# Patient Record
Sex: Male | Born: 1955
Health system: Southern US, Community
[De-identification: ages and names within clinical notes are randomized; demographics above are authoritative.]

## PROBLEM LIST (undated history)

## (undated) DIAGNOSIS — G4733 Obstructive sleep apnea (adult) (pediatric): Secondary | ICD-10-CM

## (undated) DIAGNOSIS — F5102 Adjustment insomnia: Secondary | ICD-10-CM

## (undated) DIAGNOSIS — E782 Mixed hyperlipidemia: Secondary | ICD-10-CM

## (undated) DIAGNOSIS — Z9049 Acquired absence of other specified parts of digestive tract: Secondary | ICD-10-CM

## (undated) DIAGNOSIS — E298 Other testicular dysfunction: Secondary | ICD-10-CM

## (undated) DIAGNOSIS — N200 Calculus of kidney: Secondary | ICD-10-CM

## (undated) DIAGNOSIS — I10 Essential (primary) hypertension: Secondary | ICD-10-CM

## (undated) DIAGNOSIS — Z9089 Acquired absence of other organs: Secondary | ICD-10-CM

## (undated) HISTORY — PX: KNEE SURGERY: SHX244

## (undated) HISTORY — DX: Adjustment insomnia: F51.02

## (undated) HISTORY — PX: APPENDECTOMY: SHX54

## (undated) HISTORY — PX: TONSILLECTOMY: SUR1361

## (undated) HISTORY — DX: Other testicular dysfunction: E29.8

## (undated) HISTORY — DX: Essential (primary) hypertension: I10

## (undated) HISTORY — DX: Mixed hyperlipidemia: E78.2

## (undated) HISTORY — DX: Obstructive sleep apnea (adult) (pediatric): G47.33

---
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
## 2009-12-15 ENCOUNTER — Inpatient Hospital Stay (HOSPITAL_COMMUNITY): Admission: RE | Admit: 2009-12-15 | Discharge: 2009-12-16 | Payer: Self-pay | Admitting: *Deleted

## 2010-05-06 NOTE — Discharge Summary (Signed)
  NAME:  Robert Mckinney, Robert Mckinney                   ACCOUNT NO.:  [id number]  MEDICAL RECORD NO.:  [id number]          PATIENT TYPE:  INP  LOCATION:  5039                         FACILITY:  [facility]  PHYSICIAN:  Estill Bamberg, MD      DATE OF BIRTH:  [birth year]  DATE OF ADMISSION:  12/15/2009 DATE OF DISCHARGE:  12/16/2009                              DISCHARGE SUMMARY   ADMISSION DIAGNOSIS:  Acute right quadriceps tear.  DISCHARGE DIAGNOSIS:  Acute right quadriceps tear status post right quadriceps repair.  ADMITTING PHYSICIAN:  Estill Bamberg, MD  ADMISSION HISTORY:  Briefly, Mr. Pischke is a pleasant 55 year old male who presented to my office with acute right quadriceps tear.  I therefore had a discussion with the patient regarding going forward with repair of his torn right quadriceps tendon.  The patient was therefore admitted on December 15, 2009, for the procedure outlined above.  HOSPITAL COURSE:  On December 15, 2009, the patient underwent the procedure outlined above.  He tolerated the procedure well and was transferred to recovery in stable condition.  The patient was observed overnight for pain control.  On the morning of postoperative day #1, it was felt that the patient's pain was well controlled and the patient will be safe for discharge home.  DISCHARGE INSTRUCTIONS:  The patient was given a Bledsoe brace that was to be locked in extension.  The patient was told he can bear full weight with his knee in full extension initially.  I will see the patient in my office in approximately 2 weeks to help guide him through a progressive range of motion and strengthening program.  The patient was given Percocet for pain.     Estill Bamberg, MD     MD/MEDQ  D:  05/02/2010  T:  05/02/2010  Job:  [id number]  Electronically Signed by Estill Bamberg  on 05/06/2010 08:12:35 AM

## 2010-05-19 LAB — SURGICAL PCR SCREEN
MRSA, PCR: NEGATIVE
Staphylococcus aureus: POSITIVE — AB

## 2010-05-19 LAB — DIFFERENTIAL
Eosinophils Absolute: 0.1 10*3/uL (ref 0.0–0.7)
Eosinophils Relative: 1 % (ref 0–5)
Lymphocytes Relative: 28 % (ref 12–46)
Lymphs Abs: 2.3 10*3/uL (ref 0.7–4.0)
Monocytes Relative: 10 % (ref 3–12)
Neutrophils Relative %: 61 % (ref 43–77)

## 2010-05-19 LAB — COMPREHENSIVE METABOLIC PANEL
ALT: 34 U/L (ref 0–53)
AST: 25 U/L (ref 0–37)
Calcium: 9.6 mg/dL (ref 8.4–10.5)
Creatinine, Ser: 1.27 mg/dL (ref 0.4–1.5)
GFR calc Af Amer: >60 mL/min (ref >60)
Sodium: 141 mEq/L (ref 135–145)
Total Protein: 6.6 g/dL (ref 6.0–8.3)

## 2010-05-19 LAB — URINALYSIS, ROUTINE W REFLEX MICROSCOPIC
Hgb urine dipstick: NEGATIVE
Protein, ur: NEGATIVE mg/dL
Specific Gravity, Urine: 1.021 (ref 1.005–1.030)
Urobilinogen, UA: 0.2 mg/dL (ref 0.0–1.0)

## 2010-05-19 LAB — CBC
MCH: 31.6 pg (ref 26.0–34.0)
MCHC: 34.9 g/dL (ref 30.0–36.0)
RDW: 13.8 % (ref 11.5–15.5)

## 2010-05-19 LAB — PROTIME-INR
INR: 0.98 (ref 0.00–1.49)
Prothrombin Time: 13.2 seconds (ref 11.6–15.2)

## 2010-05-19 LAB — TYPE AND SCREEN

## 2012-12-23 ENCOUNTER — Encounter: Payer: Self-pay | Admitting: Pulmonary Disease

## 2012-12-23 ENCOUNTER — Ambulatory Visit (INDEPENDENT_AMBULATORY_CARE_PROVIDER_SITE_OTHER): Payer: BC Managed Care – PPO | Admitting: Pulmonary Disease

## 2012-12-23 VITALS — BP 132/94 | HR 68 | Temp 98.2°F | Ht 70.0 in | Wt 249.6 lb

## 2012-12-23 DIAGNOSIS — G4733 Obstructive sleep apnea (adult) (pediatric): Secondary | ICD-10-CM | POA: Insufficient documentation

## 2012-12-23 NOTE — Progress Notes (Signed)
Subjective:    Patient ID: Robert Mckinney, male    DOB: 1955/03/25, 57 y.o.   MRN: [id number]  HPI The patient is a 57 year old male who comes in today as a self-referral for management of obstructive sleep apnea.  He was diagnosed in 2004 with sleep apnea and started on CPAP.  He has worn the machine every night with a nasal mask and believes that his pressure was initially set at 14 cm of water.  He has replaced his machine approximately one year ago with an automatic device.  He feels that he sleeps well with the device, and is having no issues with tolerance.  He is having issues with his tongue pushing his teeth forward for years, and uses a bite guard.  He feels rested in the mornings upon arising, and has no alert this issues during the day or evening.  He has no sleepiness with driving.  His weight is down some since his original sleep study, and his Epworth score today is only 4   Sleep Questionnaire What time do you typically go to bed?( Between what hours) 10p 10p at 1147 on 12/23/12 by Maisie Fus, CMA How long does it take you to fall asleep? 5 mins 5 mins at 1147 on 12/23/12 by Maisie Fus, CMA How many times during the night do you wake up? 3 3 at 1147 on 12/23/12 by Maisie Fus, CMA What time do you get out of bed to start your day? 0630 0630 at 1147 on 12/23/12 by Maisie Fus, CMA Do you drive or operate heavy machinery in your occupation? No No at 1147 on 12/23/12 by Maisie Fus, CMA How much has your weight changed (up or down) over the past two years? (In pounds) 0 oz (0 kg) 0 oz (0 kg) at 1147 on 12/23/12 by Maisie Fus, CMA Have you ever had a sleep study before? Yes Yes at 1147 on 12/23/12 by Maisie Fus, CMA If yes, location of study? [facility]-- Dr Janee Morn-- Dr Blenda Nicely at 1147 on 12/23/12 by Maisie Fus, CMA If yes, date of study? 2012 2012 at 1147 on 12/23/12 by Maisie Fus, CMA Do you currently use CPAP? Yes Yes at 1147 on  12/23/12 by Maisie Fus, CMA If so, what pressure? 14 14 at 1147 on 12/23/12 by Maisie Fus, CMA Do you wear oxygen at any time? No    Review of Systems  Constitutional: Negative for fever and unexpected weight change.  HENT: Negative for congestion, dental problem, ear pain, nosebleeds, postnasal drip, rhinorrhea, sinus pressure, sneezing, sore throat and trouble swallowing.   Eyes: Negative for redness and itching.  Respiratory: Negative for cough, chest tightness, shortness of breath and wheezing.   Cardiovascular: Negative for palpitations and leg swelling.  Gastrointestinal: Negative for nausea and vomiting.  Genitourinary: Negative for dysuria.  Musculoskeletal: Negative for joint swelling.  Skin: Negative for rash.  Neurological: Negative for headaches.  Hematological: Does not bruise/bleed easily.  Psychiatric/Behavioral: Negative for dysphoric mood. The patient is not nervous/anxious.        Objective:   Physical Exam Constitutional:  Overweight male, no acute distress  HENT:  Nares patent without discharge, deviated septum to left with narrowing.   Oropharynx without exudate but small, palate and uvula are elongated and thickened.   Eyes:  Perrla, eomi, no scleral icterus  Neck:  No JVD, no TMG  Cardiovascular:  Normal rate, regular rhythm, no rubs or gallops.  No murmurs        Intact distal pulses  Pulmonary :  Normal breath sounds, no stridor or respiratory distress   No rales, rhonchi, or wheezing  Abdominal:  Soft, nondistended, bowel sounds present.  No tenderness noted.   Musculoskeletal:  No lower extremity edema noted.  Lymph Nodes:  No cervical lymphadenopathy noted  Skin:  No cyanosis noted  Neurologic:  Alert, appropriate, moves all 4 extremities without obvious deficit.         Assessment & Plan:

## 2012-12-23 NOTE — Patient Instructions (Signed)
Continue with cpap, and keep up with mask changes and supplies. Work on weight loss followup with me in one year, but call if having issues.

## 2012-12-23 NOTE — Assessment & Plan Note (Signed)
The patient has a history of obstructive sleep apnea, and has been doing very well on CPAP.  He wears the device every night compliantly, and feels that he rests fairly well.  He denies any sleepiness during the day with inactivity, and no sleepiness with driving.  Overall, he is very satisfied with his response to treatment.  I have encouraged him to work aggressively on weight loss, and to keep up with his mask changes and supplies.  He will bring his card by for download.  I have also asked him to consider a full facemask, to see if this would help with his teeth issue.

## 2013-12-23 ENCOUNTER — Ambulatory Visit: Payer: BC Managed Care – PPO | Admitting: Pulmonary Disease

## 2016-03-14 DIAGNOSIS — R310 Gross hematuria: Secondary | ICD-10-CM | POA: Diagnosis not present

## 2016-03-14 DIAGNOSIS — N3001 Acute cystitis with hematuria: Secondary | ICD-10-CM | POA: Diagnosis not present

## 2016-03-14 DIAGNOSIS — N401 Enlarged prostate with lower urinary tract symptoms: Secondary | ICD-10-CM | POA: Diagnosis not present

## 2016-04-20 DIAGNOSIS — R05 Cough: Secondary | ICD-10-CM | POA: Diagnosis not present

## 2016-05-02 DIAGNOSIS — M79671 Pain in right foot: Secondary | ICD-10-CM | POA: Diagnosis not present

## 2016-05-15 DIAGNOSIS — M1711 Unilateral primary osteoarthritis, right knee: Secondary | ICD-10-CM | POA: Diagnosis not present

## 2016-05-22 DIAGNOSIS — M1711 Unilateral primary osteoarthritis, right knee: Secondary | ICD-10-CM | POA: Diagnosis not present

## 2016-05-29 DIAGNOSIS — M1711 Unilateral primary osteoarthritis, right knee: Secondary | ICD-10-CM | POA: Diagnosis not present

## 2016-08-03 DIAGNOSIS — J180 Bronchopneumonia, unspecified organism: Secondary | ICD-10-CM | POA: Diagnosis not present

## 2016-09-04 DIAGNOSIS — E782 Mixed hyperlipidemia: Secondary | ICD-10-CM | POA: Diagnosis not present

## 2016-09-04 DIAGNOSIS — R030 Elevated blood-pressure reading, without diagnosis of hypertension: Secondary | ICD-10-CM | POA: Diagnosis not present

## 2016-10-13 DIAGNOSIS — I10 Essential (primary) hypertension: Secondary | ICD-10-CM | POA: Diagnosis not present

## 2016-10-13 DIAGNOSIS — F5102 Adjustment insomnia: Secondary | ICD-10-CM | POA: Diagnosis not present

## 2017-01-11 DIAGNOSIS — J180 Bronchopneumonia, unspecified organism: Secondary | ICD-10-CM | POA: Diagnosis not present

## 2017-06-11 DIAGNOSIS — D2239 Melanocytic nevi of other parts of face: Secondary | ICD-10-CM | POA: Diagnosis not present

## 2017-06-11 DIAGNOSIS — D485 Neoplasm of uncertain behavior of skin: Secondary | ICD-10-CM | POA: Diagnosis not present

## 2017-06-11 DIAGNOSIS — L821 Other seborrheic keratosis: Secondary | ICD-10-CM | POA: Diagnosis not present

## 2017-06-18 DIAGNOSIS — M1711 Unilateral primary osteoarthritis, right knee: Secondary | ICD-10-CM | POA: Diagnosis not present

## 2017-07-12 DIAGNOSIS — M1711 Unilateral primary osteoarthritis, right knee: Secondary | ICD-10-CM | POA: Diagnosis not present

## 2017-07-23 DIAGNOSIS — M1711 Unilateral primary osteoarthritis, right knee: Secondary | ICD-10-CM | POA: Diagnosis not present

## 2017-08-01 DIAGNOSIS — M1711 Unilateral primary osteoarthritis, right knee: Secondary | ICD-10-CM | POA: Diagnosis not present

## 2017-09-25 DIAGNOSIS — L578 Other skin changes due to chronic exposure to nonionizing radiation: Secondary | ICD-10-CM | POA: Diagnosis not present

## 2017-09-25 DIAGNOSIS — L821 Other seborrheic keratosis: Secondary | ICD-10-CM | POA: Diagnosis not present

## 2017-09-25 DIAGNOSIS — B351 Tinea unguium: Secondary | ICD-10-CM | POA: Diagnosis not present

## 2017-09-25 DIAGNOSIS — L570 Actinic keratosis: Secondary | ICD-10-CM | POA: Diagnosis not present

## 2017-12-11 DIAGNOSIS — F5102 Adjustment insomnia: Secondary | ICD-10-CM | POA: Diagnosis not present

## 2017-12-11 DIAGNOSIS — Z23 Encounter for immunization: Secondary | ICD-10-CM | POA: Diagnosis not present

## 2018-05-21 DIAGNOSIS — M1711 Unilateral primary osteoarthritis, right knee: Secondary | ICD-10-CM | POA: Diagnosis not present

## 2018-06-05 DIAGNOSIS — M1711 Unilateral primary osteoarthritis, right knee: Secondary | ICD-10-CM | POA: Diagnosis not present

## 2018-06-13 DIAGNOSIS — M1711 Unilateral primary osteoarthritis, right knee: Secondary | ICD-10-CM | POA: Diagnosis not present

## 2018-06-20 DIAGNOSIS — M1711 Unilateral primary osteoarthritis, right knee: Secondary | ICD-10-CM | POA: Diagnosis not present

## 2018-07-08 DIAGNOSIS — Z6835 Body mass index (BMI) 35.0-35.9, adult: Secondary | ICD-10-CM | POA: Diagnosis not present

## 2018-07-08 DIAGNOSIS — Z1159 Encounter for screening for other viral diseases: Secondary | ICD-10-CM | POA: Diagnosis not present

## 2018-07-08 DIAGNOSIS — F5102 Adjustment insomnia: Secondary | ICD-10-CM | POA: Diagnosis not present

## 2018-07-08 DIAGNOSIS — I10 Essential (primary) hypertension: Secondary | ICD-10-CM | POA: Diagnosis not present

## 2018-10-02 DIAGNOSIS — F419 Anxiety disorder, unspecified: Secondary | ICD-10-CM | POA: Diagnosis not present

## 2018-10-02 DIAGNOSIS — F438 Other reactions to severe stress: Secondary | ICD-10-CM | POA: Diagnosis not present

## 2018-10-08 DIAGNOSIS — F419 Anxiety disorder, unspecified: Secondary | ICD-10-CM | POA: Diagnosis not present

## 2018-10-08 DIAGNOSIS — F438 Other reactions to severe stress: Secondary | ICD-10-CM | POA: Diagnosis not present

## 2018-10-15 DIAGNOSIS — F419 Anxiety disorder, unspecified: Secondary | ICD-10-CM | POA: Diagnosis not present

## 2018-10-15 DIAGNOSIS — F438 Other reactions to severe stress: Secondary | ICD-10-CM | POA: Diagnosis not present

## 2018-10-22 DIAGNOSIS — F438 Other reactions to severe stress: Secondary | ICD-10-CM | POA: Diagnosis not present

## 2018-10-22 DIAGNOSIS — F419 Anxiety disorder, unspecified: Secondary | ICD-10-CM | POA: Diagnosis not present

## 2018-11-05 DIAGNOSIS — F438 Other reactions to severe stress: Secondary | ICD-10-CM | POA: Diagnosis not present

## 2018-11-05 DIAGNOSIS — F419 Anxiety disorder, unspecified: Secondary | ICD-10-CM | POA: Diagnosis not present

## 2018-12-12 DIAGNOSIS — I10 Essential (primary) hypertension: Secondary | ICD-10-CM | POA: Diagnosis not present

## 2018-12-12 DIAGNOSIS — F5102 Adjustment insomnia: Secondary | ICD-10-CM | POA: Diagnosis not present

## 2018-12-12 DIAGNOSIS — E782 Mixed hyperlipidemia: Secondary | ICD-10-CM | POA: Diagnosis not present

## 2018-12-12 DIAGNOSIS — Z1159 Encounter for screening for other viral diseases: Secondary | ICD-10-CM | POA: Diagnosis not present

## 2018-12-12 DIAGNOSIS — Z23 Encounter for immunization: Secondary | ICD-10-CM | POA: Diagnosis not present

## 2019-04-14 ENCOUNTER — Ambulatory Visit: Payer: BC Managed Care – PPO | Attending: Internal Medicine

## 2019-04-14 DIAGNOSIS — Z23 Encounter for immunization: Secondary | ICD-10-CM | POA: Insufficient documentation

## 2019-04-14 NOTE — Progress Notes (Signed)
   Covid-19 Vaccination Clinic  Name:  Robert Mckinney    MRN: [id number] DOB: 13-Jul-1955  04/14/2019  [name] was observed post Covid-19 immunization for 15 minutes without incidence. He was provided with Vaccine Information Sheet and instruction to access the V-Safe system.   [name] was instructed to call 911 with any severe reactions post vaccine: Marland Kitchen Difficulty breathing  . Swelling of your face and throat  . A fast heartbeat  . A bad rash all over your body  . Dizziness and weakness    Immunizations Administered    Name Date Dose VIS Date Route   Pfizer COVID-19 Vaccine 04/14/2019  4:34 PM 0.3 mL 02/14/2019 Intramuscular   Manufacturer: [place]   Lot: [id number]   [place]: [id number]

## 2019-04-20 DIAGNOSIS — F5102 Adjustment insomnia: Secondary | ICD-10-CM | POA: Insufficient documentation

## 2019-04-20 DIAGNOSIS — I10 Essential (primary) hypertension: Secondary | ICD-10-CM | POA: Insufficient documentation

## 2019-04-20 DIAGNOSIS — E782 Mixed hyperlipidemia: Secondary | ICD-10-CM | POA: Insufficient documentation

## 2019-04-21 ENCOUNTER — Encounter: Payer: Self-pay | Admitting: Legal Medicine

## 2019-04-21 ENCOUNTER — Ambulatory Visit (INDEPENDENT_AMBULATORY_CARE_PROVIDER_SITE_OTHER): Payer: BC Managed Care – PPO | Admitting: Legal Medicine

## 2019-04-21 ENCOUNTER — Other Ambulatory Visit: Payer: Self-pay

## 2019-04-21 DIAGNOSIS — F4321 Adjustment disorder with depressed mood: Secondary | ICD-10-CM | POA: Diagnosis not present

## 2019-04-21 DIAGNOSIS — E782 Mixed hyperlipidemia: Secondary | ICD-10-CM

## 2019-04-21 DIAGNOSIS — F5102 Adjustment insomnia: Secondary | ICD-10-CM | POA: Diagnosis not present

## 2019-04-21 DIAGNOSIS — I10 Essential (primary) hypertension: Secondary | ICD-10-CM

## 2019-04-21 MED ORDER — ALPRAZOLAM 0.5 MG PO TABS: 0.5000 mg | ORAL_TABLET | Freq: Every evening | ORAL | 3 refills | Status: DC | PRN

## 2019-04-21 NOTE — Progress Notes (Signed)
Established Patient Office Visit  Subjective:  Patient ID: Robert Mckinney, male    DOB: [birth year]  Age: 64 y.o. MRN: [id number]  CC:  Chief Complaint  Patient presents with  . Hyperlipidemia  . Hypertension    HPI Robert Mckinney presents for Chronic visit.  Patient presents for follow up of hypertension.  Patient tolerating lisinopril well with side effects.  Patient was diagnosed with hypertension 2015 so has been treated for hypertension for 5 years.Patient is working on maintaining diet and exercise regimen and follows up as directed. Complication include nonr.  Patient presents with hyperlipidemia.  Compliance with treatment has been good; patient takes medicines as directed, maintains low cholesterol diet, follows up as directed, and maintains exercise regimen.  Patient is using rosuvastatin without problems.  Grief.  Wife died of cancer last month.  He is dysphoric.  Not sleeping. Crying a lot. Has no one to talk to. He is working now. He has friends to talk to.  Very dysphoric at night and in AM.  Past Medical History:  Diagnosis Date  . History of appendectomy   . History of tonsillectomy   . Kidney stones     Past Surgical History:  Procedure Laterality Date  . APPENDECTOMY    . KNEE SURGERY Right    2012  . TONSILLECTOMY      No family history on file.  Social History   Socioeconomic History  . Marital status: Widowed    Spouse name: Not on file  . Number of children: Not on file  . Years of education: Not on file  . Highest education level: Not on file  Occupational History  . Occupation: Sales  Tobacco Use  . Smoking status: Never Smoker  . Smokeless tobacco: Never Used  Substance and Sexual Activity  . Alcohol use: No  . Drug use: No  . Sexual activity: Not on file  Other Topics Concern  . Not on file  Social History Narrative  . Not on file   Social Determinants of Health   Financial Resource Strain:   . Difficulty of Paying Living Expenses:  Not on file  Food Insecurity:   . Worried About Charity fundraiser in the Last Year: Not on file  . Ran Out of Food in the Last Year: Not on file  Transportation Needs:   . Lack of Transportation (Medical): Not on file  . Lack of Transportation (Non-Medical): Not on file  Physical Activity:   . Days of Exercise per Week: Not on file  . Minutes of Exercise per Session: Not on file  Stress:   . Feeling of Stress : Not on file  Social Connections:   . Frequency of Communication with Friends and Family: Not on file  . Frequency of Social Gatherings with Friends and Family: Not on file  . Attends Religious Services: Not on file  . Active Member of Clubs or Organizations: Not on file  . Attends Archivist Meetings: Not on file  . Marital Status: Not on file  Intimate Partner Violence:   . Fear of Current or Ex-Partner: Not on file  . Emotionally Abused: Not on file  . Physically Abused: Not on file  . Sexually Abused: Not on file    Outpatient Medications Prior to Visit  Medication Sig Dispense Refill  . lisinopril (ZESTRIL) 20 MG tablet Take 20 mg by mouth daily.    . rosuvastatin (CRESTOR) 20 MG tablet Take 20 mg by mouth at  bedtime.    . ALPRAZolam (XANAX) 0.5 MG tablet Take 0.5 mg by mouth at bedtime as needed.     No facility-administered medications prior to visit.    No Known Allergies  ROS Review of Systems  Constitutional: Negative.   HENT: Negative.   Eyes: Negative.   Respiratory: Negative.   Cardiovascular: Negative.   Gastrointestinal: Negative.   Endocrine: Negative.   Genitourinary: Negative.   Musculoskeletal: Negative.   Skin: Negative.   Neurological: Negative.   Psychiatric/Behavioral: Positive for dysphoric mood.      Objective:    Physical Exam  Constitutional: He is oriented to person, place, and time. He appears well-developed and well-nourished.  HENT:  Head: Normocephalic and atraumatic.  Eyes: Pupils are equal, round, and  reactive to light. Conjunctivae and EOM are normal.  Cardiovascular: Normal rate, regular rhythm and normal heart sounds.  Pulmonary/Chest: Effort normal and breath sounds normal.  Abdominal: Soft. Bowel sounds are normal.  Musculoskeletal:        General: Normal range of motion.     Cervical back: Normal range of motion and neck supple.  Neurological: He is alert and oriented to person, place, and time. He has normal reflexes.  Skin: Skin is warm.  Psychiatric:  Depressed from grief  Vitals reviewed.   BP 137/82   Pulse 65   Temp 98.6 F (37 C)   Ht 5\' 10"  (1.778 m)   Wt 245 lb (111.1 kg)   SpO2 97%   BMI 35.15 kg/m  Wt Readings from Last 3 Encounters:  04/21/19 245 lb (111.1 kg)  12/23/12 249 lb 9.6 oz (113.2 kg)     Health Maintenance Due  Topic Date Due  . Hepatitis C Screening  Apr 05, 1955  . HIV Screening  08/27/1970  . TETANUS/TDAP  08/27/1974  . COLONOSCOPY  08/26/2005  . INFLUENZA VACCINE  10/05/2018    There are no preventive care reminders to display for this patient.  No results found for: TSH Lab Results  Component Value Date   WBC 8.1 12/13/2009   HGB 14.9 12/13/2009   HCT 42.7 12/13/2009   MCV 90.7 12/13/2009   PLT 173 12/13/2009   Lab Results  Component Value Date   NA 141 12/13/2009   K 4.4 12/13/2009   CO2 28 12/13/2009   GLUCOSE 107 (H) 12/13/2009   BUN 14 12/13/2009   CREATININE 1.27 12/13/2009   BILITOT 1.2 12/13/2009   ALKPHOS 78 12/13/2009   AST 25 12/13/2009   ALT 34 12/13/2009   PROT 6.6 12/13/2009   ALBUMIN 4.3 12/13/2009   CALCIUM 9.6 12/13/2009   No results found for: CHOL No results found for: HDL No results found for: LDLCALC No results found for: TRIG No results found for: CHOLHDL No results found for: HGBA1C    Assessment & Plan:   Problem List Items Addressed This Visit      Cardiovascular and Mediastinum   Essential hypertension, benign (Chronic)    An individual care plan was established and reinforced  today.  The patient's status was assessed using clinical findings on exam and labs or diagnostic tests. The patient's success at meeting treatment goals on disease specific evidence-based guidelines and found to be good controlled. SELF MANAGEMENT: The patient and I together assessed ways to personally work towards obtaining the recommended goals. RECOMMENDATIONS: avoid decongestants found in common cold remedies, decrease consumption of alcohol, perform routine monitoring of BP with home BP cuff, exercise, reduction of dietary salt, take medicines as prescribed, try  not to miss doses and quit smoking.  Regular exercise and maintaining a healthy weight is needed.  Stress reduction may help. A CLINICAL SUMMARY including written plan identify barriers to care unique to individual due to social or financial issues.  We attempt to mutually creat solutions for individual and family understanding.      Relevant Medications   lisinopril (ZESTRIL) 20 MG tablet   rosuvastatin (CRESTOR) 20 MG tablet   Other Relevant Orders   CBC with Differential   Comprehensive metabolic panel     Other   Mixed hyperlipidemia (Chronic)    AN INDIVIDUAL CARE PLAN was established and reinforced today.  The patient's status was assessed using clinical findings on exam, lab and other diagnostic tests. The patient's disease status was assessed based on evidence-based guidelines and found to be well controlled. MEDICATIONS were reviewed . SELF MANAGEMENT GOALS have been discussed and patient's success at attaining the goal of low cholesterol was assessed. RECOMMENDATION given include regular exercise 3 days a week and low cholesterol/low fat diet. CLINICAL SUMMARY including written plan to identify barriers unique to the patient due to social or economic  reasons was discussed.      Relevant Medications   lisinopril (ZESTRIL) 20 MG tablet   rosuvastatin (CRESTOR) 20 MG tablet   Other Relevant Orders   LDL Cholesterol,  Direct   Adjustment insomnia    AN INDIVIDUAL CARE PLAN was established and reinforced today.  The patient's status was assessed using clinical findings on exam, labs, and other diagnostic testing. Patient's success at meeting treatment goals based on disease specific evidence-bassed guidelines and found to be in good control. RECOMMENDATIONS include maintaining present medicines and treatment.      Relevant Medications   ALPRAZolam (XANAX) 0.5 MG tablet   Grief    Patient's depression is controlled with xanax for sleep.   Anhedonia better.  PHQ 9 not done at this visit performed, no performed} score NA. An individual care plan was established or reinforced today.  The patient's disease status was assessed using clinical findings on exam, labs, and or other diagnostic testing to determine patient's success in meeting treatment goals based on disease specific evidence-based guidelines and found to be improving Recommendations include stay on medicines.  Call if depression worsens.      Relevant Medications   ALPRAZolam (XANAX) 0.5 MG tablet      Meds ordered this encounter  Medications  . ALPRAZolam (XANAX) 0.5 MG tablet    Sig: Take 1 tablet (0.5 mg total) by mouth at bedtime as needed.    Dispense:  30 tablet    Refill:  3    Follow-up: Return in about 4 months (around 08/19/2019) for fasting.    Reinaldo Meeker, MD

## 2019-04-21 NOTE — Assessment & Plan Note (Signed)
An individual care plan was established and reinforced today.  The patient's status was assessed using clinical findings on exam and labs or diagnostic tests. The patient's success at meeting treatment goals on disease specific evidence-based guidelines and found to be good controlled. SELF MANAGEMENT: The patient and I together assessed ways to personally work towards obtaining the recommended goals. RECOMMENDATIONS: avoid decongestants found in common cold remedies, decrease consumption of alcohol, perform routine monitoring of BP with home BP cuff, exercise, reduction of dietary salt, take medicines as prescribed, try not to miss doses and quit smoking.  Regular exercise and maintaining a healthy weight is needed.  Stress reduction may help. A CLINICAL SUMMARY including written plan identify barriers to care unique to individual due to social or financial issues.  We attempt to mutually creat solutions for individual and family understanding. 

## 2019-04-21 NOTE — Assessment & Plan Note (Signed)

## 2019-04-21 NOTE — Assessment & Plan Note (Signed)
AN INDIVIDUAL CARE PLAN was established and reinforced today.  The patient's status was assessed using clinical findings on exam, labs, and other diagnostic testing. Patient's success at meeting treatment goals based on disease specific evidence-bassed guidelines and found to be in good control. RECOMMENDATIONS include maintaining present medicines and treatment. 

## 2019-04-21 NOTE — Assessment & Plan Note (Signed)
Patient's depression is controlled with xanax for sleep.   Anhedonia better.  PHQ 9 not done at this visit performed, no performed} score NA. An individual care plan was established or reinforced today.  The patient's disease status was assessed using clinical findings on exam, labs, and or other diagnostic testing to determine patient's success in meeting treatment goals based on disease specific evidence-based guidelines and found to be improving Recommendations include stay on medicines.  Call if depression worsens.

## 2019-04-22 LAB — COMPREHENSIVE METABOLIC PANEL
ALT: 27 IU/L (ref 0–44)
AST: 18 IU/L (ref 0–40)
Albumin/Globulin Ratio: 2 (ref 1.2–2.2)
Albumin: 4.4 g/dL (ref 3.8–4.8)
Alkaline Phosphatase: 108 IU/L (ref 39–117)
BUN/Creatinine Ratio: 12 (ref 10–24)
BUN: 13 mg/dL (ref 8–27)
Bilirubin Total: 0.7 mg/dL (ref 0.0–1.2)
CO2: 19 mmol/L — ABNORMAL LOW (ref 20–29)
Calcium: 10.7 mg/dL — ABNORMAL HIGH (ref 8.6–10.2)
Chloride: 106 mmol/L (ref 96–106)
Creatinine, Ser: 1.1 mg/dL (ref 0.76–1.27)
GFR calc Af Amer: 82 mL/min/{1.73_m2} (ref >59)
GFR calc non Af Amer: 71 mL/min/{1.73_m2} (ref >59)
Globulin, Total: 2.2 g/dL (ref 1.5–4.5)
Glucose: 104 mg/dL — ABNORMAL HIGH (ref 65–99)
Potassium: 4.4 mmol/L (ref 3.5–5.2)
Sodium: 140 mmol/L (ref 134–144)
Total Protein: 6.6 g/dL (ref 6.0–8.5)

## 2019-04-22 LAB — CBC WITH DIFFERENTIAL/PLATELET
Basophils Absolute: 0 10*3/uL (ref 0.0–0.2)
Basos: 1 %
EOS (ABSOLUTE): 0.2 10*3/uL (ref 0.0–0.4)
Eos: 3 %
Hematocrit: 44.1 % (ref 37.5–51.0)
Hemoglobin: 15.5 g/dL (ref 13.0–17.7)
Immature Grans (Abs): 0 10*3/uL (ref 0.0–0.1)
Immature Granulocytes: 1 %
Lymphocytes Absolute: 1.6 10*3/uL (ref 0.7–3.1)
Lymphs: 27 %
MCH: 31.9 pg (ref 26.6–33.0)
MCHC: 35.1 g/dL (ref 31.5–35.7)
MCV: 91 fL (ref 79–97)
Monocytes Absolute: 0.7 10*3/uL (ref 0.1–0.9)
Monocytes: 11 %
Neutrophils Absolute: 3.5 10*3/uL (ref 1.4–7.0)
Neutrophils: 57 %
Platelets: 160 10*3/uL (ref 150–450)
RBC: 4.86 x10E6/uL (ref 4.14–5.80)
RDW: 12.9 % (ref 11.6–15.4)
WBC: 6 10*3/uL (ref 3.4–10.8)

## 2019-04-22 LAB — LDL CHOLESTEROL, DIRECT: LDL Direct: 84 mg/dL (ref 0–99)

## 2019-04-22 NOTE — Progress Notes (Signed)
No anemia, kidney tests fine, calcium slightly high- do not take extra calcium, LDL 84 great lp

## 2019-05-09 ENCOUNTER — Ambulatory Visit: Payer: BC Managed Care – PPO | Attending: Internal Medicine

## 2019-05-09 DIAGNOSIS — Z23 Encounter for immunization: Secondary | ICD-10-CM | POA: Insufficient documentation

## 2019-05-09 NOTE — Progress Notes (Signed)
   Covid-19 Vaccination Clinic  Name:  Robert Mckinney    MRN: [id number] DOB: April 12, 1955  05/09/2019  [name] was observed post Covid-19 immunization for 15 minutes without incident. He was provided with Vaccine Information Sheet and instruction to access the V-Safe system.   [name] was instructed to call 911 with any severe reactions post vaccine: Marland Kitchen Difficulty breathing  . Swelling of face and throat  . A fast heartbeat  . A bad rash all over body  . Dizziness and weakness   Immunizations Administered    Name Date Dose VIS Date Route   Pfizer COVID-19 Vaccine 05/09/2019  1:37 PM 0.3 mL 02/14/2019 Intramuscular   Manufacturer: [place]   Lot: [id number]   [place]: [id number]

## 2019-06-04 ENCOUNTER — Ambulatory Visit (INDEPENDENT_AMBULATORY_CARE_PROVIDER_SITE_OTHER): Payer: BC Managed Care – PPO | Admitting: Legal Medicine

## 2019-06-04 ENCOUNTER — Encounter: Payer: Self-pay | Admitting: Legal Medicine

## 2019-06-04 ENCOUNTER — Other Ambulatory Visit: Payer: Self-pay

## 2019-06-04 DIAGNOSIS — I119 Hypertensive heart disease without heart failure: Secondary | ICD-10-CM | POA: Insufficient documentation

## 2019-06-04 DIAGNOSIS — N529 Male erectile dysfunction, unspecified: Secondary | ICD-10-CM

## 2019-06-04 DIAGNOSIS — E298 Other testicular dysfunction: Secondary | ICD-10-CM | POA: Diagnosis not present

## 2019-06-04 DIAGNOSIS — I10 Essential (primary) hypertension: Secondary | ICD-10-CM

## 2019-06-04 DIAGNOSIS — G4733 Obstructive sleep apnea (adult) (pediatric): Secondary | ICD-10-CM | POA: Insufficient documentation

## 2019-06-04 MED ORDER — SILDENAFIL CITRATE 100 MG PO TABS: 50.0000 mg | ORAL_TABLET | Freq: Every day | ORAL | 6 refills | Status: DC | PRN

## 2019-06-04 NOTE — Progress Notes (Signed)
Acute Office Visit  Subjective:    Patient ID: Robert Mckinney, male    DOB: 1955/10/09, 64 y.o.   MRN: [id number]  Chief Complaint  Patient presents with  . Erectile Dysfunction    HPI Patient is in today for ED.  He had for 10 years a go.  His wife recently died.  He gets a full erection but doe snot stay for intercourse.  He formerly was on viagra.  Patient presents for follow up of hypertension.  Patient tolerating lisinopril well with side effects.  Patient was diagnosed with hypertension 2010 so has been treated for hypertension for 10 years.Patient is working on maintaining diet and exercise regimen and follows up as directed. Complication include none.  Past Medical History:  Diagnosis Date  . Adjustment insomnia   . Benign essential hypertension   . History of appendectomy   . History of tonsillectomy   . Kidney stones   . Mixed hyperlipidemia   . Obstructive sleep apnea (adult) (pediatric)   . Other testicular dysfunction     Past Surgical History:  Procedure Laterality Date  . APPENDECTOMY    . KNEE SURGERY Right    2012  . TONSILLECTOMY      History reviewed. No pertinent family history.  Social History   Socioeconomic History  . Marital status: Widowed    Spouse name: Not on file  . Number of children: 2  . Years of education: Not on file  . Highest education level: Not on file  Occupational History  . Occupation: Sales  Tobacco Use  . Smoking status: Never Smoker  . Smokeless tobacco: Never Used  Substance and Sexual Activity  . Alcohol use: Yes    Alcohol/week: 1.0 standard drinks    Types: 1 Cans of beer per week    Comment: Once a week  . Drug use: No  . Sexual activity: Yes    Partners: Female  Other Topics Concern  . Not on file  Social History Narrative  . Not on file   Social Determinants of Health   Financial Resource Strain:   . Difficulty of Paying Living Expenses:   Food Insecurity:   . Worried About Charity fundraiser in  the Last Year:   . Arboriculturist in the Last Year:   Transportation Needs:   . Film/video editor (Medical):   Marland Kitchen Lack of Transportation (Non-Medical):   Physical Activity:   . Days of Exercise per Week:   . Minutes of Exercise per Session:   Stress:   . Feeling of Stress :   Social Connections:   . Frequency of Communication with Friends and Family:   . Frequency of Social Gatherings with Friends and Family:   . Attends Religious Services:   . Active Member of Clubs or Organizations:   . Attends Archivist Meetings:   Marland Kitchen Marital Status:   Intimate Partner Violence:   . Fear of Current or Ex-Partner:   . Emotionally Abused:   Marland Kitchen Physically Abused:   . Sexually Abused:     Outpatient Medications Prior to Visit  Medication Sig Dispense Refill  . ALPRAZolam (XANAX) 0.5 MG tablet Take 1 tablet (0.5 mg total) by mouth at bedtime as needed. 30 tablet 3  . lisinopril (ZESTRIL) 20 MG tablet Take 20 mg by mouth daily.    . rosuvastatin (CRESTOR) 20 MG tablet Take 20 mg by mouth at bedtime.     No facility-administered medications prior to  visit.    No Known Allergies  Review of Systems  Constitutional: Negative.   HENT: Negative.   Eyes: Negative.   Respiratory: Negative.   Cardiovascular: Negative.   Gastrointestinal: Negative.   Genitourinary: Negative.   Musculoskeletal: Negative.   Neurological: Negative.   Psychiatric/Behavioral: Positive for dysphoric mood.       Objective:    Physical Exam Vitals reviewed.  Constitutional:      Appearance: Normal appearance.  HENT:     Head: Normocephalic and atraumatic.     Right Ear: Tympanic membrane normal.     Left Ear: Tympanic membrane normal.  Cardiovascular:     Rate and Rhythm: Normal rate and regular rhythm.     Pulses: Normal pulses.     Heart sounds: Normal heart sounds.  Pulmonary:     Effort: Pulmonary effort is normal.     Breath sounds: Normal breath sounds.  Abdominal:     General: Bowel  sounds are normal.  Genitourinary:    Penis: Normal.      Testes: Normal.  Musculoskeletal:     Cervical back: Normal range of motion.  Neurological:     Mental Status: He is alert.     BP 120/76 (BP Location: Right Arm, Patient Position: Sitting)   Pulse 76   Temp 98.3 F (36.8 C) (Temporal)   Resp 17   Ht 5\' 9"  (1.753 m)   Wt 245 lb (111.1 kg)   BMI 36.18 kg/m  Wt Readings from Last 3 Encounters:  06/04/19 245 lb (111.1 kg)  04/21/19 245 lb (111.1 kg)  12/23/12 249 lb 9.6 oz (113.2 kg)    Health Maintenance Due  Topic Date Due  . Hepatitis C Screening  Never done  . HIV Screening  Never done  . TETANUS/TDAP  Never done  . COLONOSCOPY  Never done    There are no preventive care reminders to display for this patient.   No results found for: TSH Lab Results  Component Value Date   WBC 6.0 04/21/2019   HGB 15.5 04/21/2019   HCT 44.1 04/21/2019   MCV 91 04/21/2019   PLT 160 04/21/2019   Lab Results  Component Value Date   NA 140 04/21/2019   K 4.4 04/21/2019   CO2 19 (L) 04/21/2019   GLUCOSE 104 (H) 04/21/2019   BUN 13 04/21/2019   CREATININE 1.10 04/21/2019   BILITOT 0.7 04/21/2019   ALKPHOS 108 04/21/2019   AST 18 04/21/2019   ALT 27 04/21/2019   PROT 6.6 04/21/2019   ALBUMIN 4.4 04/21/2019   CALCIUM 10.7 (H) 04/21/2019   No results found for: CHOL No results found for: HDL No results found for: LDLCALC No results found for: TRIG No results found for: CHOLHDL No results found for: HGBA1C     Assessment & Plan:   Problem List Items Addressed This Visit      Cardiovascular and Mediastinum   Benign essential hypertension    An individual hypertension care plan was established and reinforced today.  The patient's status was assessed using clinical findings on exam and labs or diagnostic tests. The patient's success at meeting treatment goals on disease specific evidence-based guidelines and found to be well controlled. SELF MANAGEMENT: The  patient and I together assessed ways to personally work towards obtaining the recommended goals. RECOMMENDATIONS: avoid decongestants found in common cold remedies, decrease consumption of alcohol, perform routine monitoring of BP with home BP cuff, exercise, reduction of dietary salt, take medicines as prescribed, try  not to miss doses and quit smoking.  Regular exercise and maintaining a healthy weight is needed.  Stress reduction may help. A CLINICAL SUMMARY including written plan identify barriers to care unique to individual due to social or financial issues.  We attempt to mutually creat solutions for individual and family understanding.      Relevant Medications   sildenafil (VIAGRA) 100 MG tablet     Endocrine   Other testicular dysfunction    Patient has a long history of ED formerly on viagra but wife got cancer and recently died.  He is now dating.        Other   ED (erectile dysfunction)   Relevant Medications   sildenafil (VIAGRA) 100 MG tablet       Meds ordered this encounter  Medications  . sildenafil (VIAGRA) 100 MG tablet    Sig: Take 0.5-1 tablets (50-100 mg total) by mouth daily as needed for erectile dysfunction.    Dispense:  10 tablet    Refill:  6     Reinaldo Meeker, MD

## 2019-06-04 NOTE — Assessment & Plan Note (Signed)
Patient has a long history of ED formerly on viagra but wife got cancer and recently died.  He is now dating.

## 2019-06-04 NOTE — Assessment & Plan Note (Signed)

## 2019-07-08 ENCOUNTER — Other Ambulatory Visit: Payer: Self-pay | Admitting: Legal Medicine

## 2019-07-08 DIAGNOSIS — E782 Mixed hyperlipidemia: Secondary | ICD-10-CM

## 2019-07-08 DIAGNOSIS — I10 Essential (primary) hypertension: Secondary | ICD-10-CM

## 2019-07-08 MED ORDER — LISINOPRIL 20 MG PO TABS: 20.0000 mg | ORAL_TABLET | Freq: Every day | ORAL | 2 refills | Status: DC

## 2019-08-04 ENCOUNTER — Other Ambulatory Visit: Payer: Self-pay | Admitting: Legal Medicine

## 2019-08-04 DIAGNOSIS — I10 Essential (primary) hypertension: Secondary | ICD-10-CM

## 2019-08-19 ENCOUNTER — Ambulatory Visit: Payer: BC Managed Care – PPO | Admitting: Legal Medicine

## 2019-11-13 ENCOUNTER — Encounter: Payer: Self-pay | Admitting: Legal Medicine

## 2019-11-13 ENCOUNTER — Ambulatory Visit (INDEPENDENT_AMBULATORY_CARE_PROVIDER_SITE_OTHER): Payer: BC Managed Care – PPO | Admitting: Legal Medicine

## 2019-11-13 ENCOUNTER — Other Ambulatory Visit: Payer: Self-pay

## 2019-11-13 VITALS — BP 140/84 | HR 69 | Temp 98.6°F | Resp 16 | Ht 70.0 in | Wt 247.0 lb

## 2019-11-13 DIAGNOSIS — N529 Male erectile dysfunction, unspecified: Secondary | ICD-10-CM | POA: Diagnosis not present

## 2019-11-13 DIAGNOSIS — I10 Essential (primary) hypertension: Secondary | ICD-10-CM | POA: Diagnosis not present

## 2019-11-13 DIAGNOSIS — R5383 Other fatigue: Secondary | ICD-10-CM

## 2019-11-13 DIAGNOSIS — F5102 Adjustment insomnia: Secondary | ICD-10-CM | POA: Diagnosis not present

## 2019-11-13 DIAGNOSIS — Z23 Encounter for immunization: Secondary | ICD-10-CM | POA: Diagnosis not present

## 2019-11-13 DIAGNOSIS — N401 Enlarged prostate with lower urinary tract symptoms: Secondary | ICD-10-CM

## 2019-11-13 DIAGNOSIS — E782 Mixed hyperlipidemia: Secondary | ICD-10-CM

## 2019-11-13 DIAGNOSIS — G4733 Obstructive sleep apnea (adult) (pediatric): Secondary | ICD-10-CM

## 2019-11-13 MED ORDER — LISINOPRIL 20 MG PO TABS: 20.0000 mg | ORAL_TABLET | Freq: Every day | ORAL | 2 refills | Status: DC

## 2019-11-13 NOTE — Progress Notes (Signed)
Subjective:  Patient ID: Robert Mckinney, male    DOB: June 20, 1955  Age: 64 y.o. MRN: [id number]  Chief Complaint  Patient presents with  . Hypertension  . Hyperlipidemia    HPI: Chronic visit  Patient presents for follow up of hypertension.  Patient tolerating lisinopril well with side effects.  Patient was diagnosed with hypertension 2010 so has been treated for hypertension for 10 years.Patient is working on maintaining diet and exercise regimen and follows up as directed. Complication include none.  Patient presents with hyperlipidemia.  Compliance with treatment has been good; patient takes medicines as directed, maintains low cholesterol diet, follows up as directed, and maintains exercise regimen.  Patient is using rosuvastatin without problems.   Current Outpatient Medications on File Prior to Visit  Medication Sig Dispense Refill  . ALPRAZolam (XANAX) 0.5 MG tablet Take 1 tablet (0.5 mg total) by mouth at bedtime as needed. 30 tablet 3  . rosuvastatin (CRESTOR) 20 MG tablet TAKE 1 TABLET(20 MG) BY MOUTH EVERY DAY 90 tablet 2  . sildenafil (VIAGRA) 100 MG tablet Take 0.5-1 tablets (50-100 mg total) by mouth daily as needed for erectile dysfunction. 10 tablet 6   No current facility-administered medications on file prior to visit.   Past Medical History:  Diagnosis Date  . Adjustment insomnia   . Benign essential hypertension   . Mixed hyperlipidemia   . Obstructive sleep apnea (adult) (pediatric)   . Other testicular dysfunction    Past Surgical History:  Procedure Laterality Date  . APPENDECTOMY    . KNEE SURGERY Right    2012  . TONSILLECTOMY      History reviewed. No pertinent family history. Social History   Socioeconomic History  . Marital status: Widowed    Spouse name: Not on file  . Number of children: 2  . Years of education: Not on file  . Highest education level: Not on file  Occupational History  . Occupation: Sales  Tobacco Use  . Smoking status:  Never Smoker  . Smokeless tobacco: Never Used  Substance and Sexual Activity  . Alcohol use: Yes    Alcohol/week: 1.0 standard drink    Types: 1 Cans of beer per week    Comment: Once a week  . Drug use: No  . Sexual activity: Yes    Partners: Female  Other Topics Concern  . Not on file  Social History Narrative  . Not on file   Social Determinants of Health   Financial Resource Strain:   . Difficulty of Paying Living Expenses: Not on file  Food Insecurity:   . Worried About Charity fundraiser in the Last Year: Not on file  . Ran Out of Food in the Last Year: Not on file  Transportation Needs:   . Lack of Transportation (Medical): Not on file  . Lack of Transportation (Non-Medical): Not on file  Physical Activity:   . Days of Exercise per Week: Not on file  . Minutes of Exercise per Session: Not on file  Stress:   . Feeling of Stress : Not on file  Social Connections:   . Frequency of Communication with Friends and Family: Not on file  . Frequency of Social Gatherings with Friends and Family: Not on file  . Attends Religious Services: Not on file  . Active Member of Clubs or Organizations: Not on file  . Attends Archivist Meetings: Not on file  . Marital Status: Not on file    Review  of Systems  Constitutional: Negative.   HENT: Negative.   Eyes: Negative.   Respiratory: Positive for shortness of breath. Negative for cough.   Cardiovascular: Negative for chest pain, palpitations and leg swelling.  Gastrointestinal: Negative.   Genitourinary: Negative.   Musculoskeletal: Negative.   Neurological: Negative.   Psychiatric/Behavioral: Negative.      Objective:  BP 140/84   Pulse 69   Temp 98.6 F (37 C)   Resp 16   Ht 5\' 10"  (1.778 m)   Wt 247 lb (112 kg)   SpO2 97%   BMI 35.44 kg/m   BP/Weight 11/13/2019 06/04/2019 0/86/7619  Systolic BP 509 326 712  Diastolic BP 84 76 82  Wt. (Lbs) 247 245 245  BMI 35.44 36.18 35.15    Physical  Exam Vitals reviewed.  Constitutional:      Appearance: Normal appearance.  HENT:     Head: Normocephalic and atraumatic.     Right Ear: Tympanic membrane, ear canal and external ear normal.     Left Ear: Tympanic membrane, ear canal and external ear normal.     Nose: Nose normal.     Mouth/Throat:     Mouth: Mucous membranes are moist.     Pharynx: Oropharynx is clear.  Eyes:     Conjunctiva/sclera: Conjunctivae normal.     Pupils: Pupils are equal, round, and reactive to light.  Cardiovascular:     Rate and Rhythm: Normal rate and regular rhythm.     Pulses: Normal pulses.     Heart sounds: Normal heart sounds.  Pulmonary:     Effort: Pulmonary effort is normal.     Breath sounds: Normal breath sounds.  Abdominal:     General: Abdomen is flat. Bowel sounds are normal.  Musculoskeletal:        General: Normal range of motion.     Cervical back: Normal range of motion and neck supple.  Skin:    General: Skin is warm and dry.     Capillary Refill: Capillary refill takes less than 2 seconds.  Neurological:     General: No focal deficit present.     Mental Status: He is alert and oriented to person, place, and time.  Psychiatric:        Mood and Affect: Mood normal.        Thought Content: Thought content normal.       Lab Results  Component Value Date   WBC 6.0 04/21/2019   HGB 15.5 04/21/2019   HCT 44.1 04/21/2019   PLT 160 04/21/2019   GLUCOSE 104 (H) 04/21/2019   LDLDIRECT 84 04/21/2019   ALT 27 04/21/2019   AST 18 04/21/2019   NA 140 04/21/2019   K 4.4 04/21/2019   CL 106 04/21/2019   CREATININE 1.10 04/21/2019   BUN 13 04/21/2019   CO2 19 (L) 04/21/2019   INR 0.98 12/13/2009      Assessment & Plan:   1. Benign essential hypertension - CBC with Differential/Platelet - Comprehensive metabolic panel An individual hypertension care plan was established and reinforced today.  The patient's status was assessed using clinical findings on exam and labs or  diagnostic tests. The patient's success at meeting treatment goals on disease specific evidence-based guidelines and found to be well controlled. SELF MANAGEMENT: The patient and I together assessed ways to personally work towards obtaining the recommended goals. RECOMMENDATIONS: avoid decongestants found in common cold remedies, decrease consumption of alcohol, perform routine monitoring of BP with home BP cuff, exercise,  reduction of dietary salt, take medicines as prescribed, try not to miss doses and quit smoking.  Regular exercise and maintaining a healthy weight is needed.  Stress reduction may help. A CLINICAL SUMMARY including written plan identify barriers to care unique to individual due to social or financial issues.  We attempt to mutually creat solutions for individual and family understanding.  2. Mixed hyperlipidemia - Lipid panel AN INDIVIDUAL CARE PLAN for hyperlipidemia/ cholesterol was established and reinforced today.  The patient's status was assessed using clinical findings on exam, lab and other diagnostic tests. The patient's disease status was assessed based on evidence-based guidelines and found to be well controlled. MEDICATIONS were reviewed. SELF MANAGEMENT GOALS have been discussed and patient's success at attaining the goal of low cholesterol was assessed. RECOMMENDATION given include regular exercise 3 days a week and low cholesterol/low fat diet. CLINICAL SUMMARY including written plan to identify barriers unique to the patient due to social or economic  reasons was discussed.  3. Adjustment insomnia This is improving  4. Vasculogenic erectile dysfunction, unspecified vasculogenic erectile dysfunction type This is controlled by viagra   5. Obstructive sleep apnea (adult) (pediatric) Patient is compliant with his CPAP  6. Essential hypertension, benign - lisinopril (ZESTRIL) 20 MG tablet; Take 1 tablet (20 mg total) by mouth daily.  Dispense: 90 tablet; Refill:  2 Same as above  7. Encounter for immunization - Flu Vaccine MDCK QUAD PF      Meds ordered this encounter  Medications  . lisinopril (ZESTRIL) 20 MG tablet    Sig: Take 1 tablet (20 mg total) by mouth daily.    Dispense:  90 tablet    Refill:  2    Orders Placed This Encounter  Procedures  . Flu Vaccine MDCK QUAD PF  . CBC with Differential/Platelet  . Comprehensive metabolic panel  . Lipid panel     Follow-up: Return in about 6 months (around 05/12/2020) for fasting.  An After Visit Summary was printed and given to the patient.  [facility] [phone]

## 2019-11-27 ENCOUNTER — Ambulatory Visit (INDEPENDENT_AMBULATORY_CARE_PROVIDER_SITE_OTHER): Payer: BC Managed Care – PPO

## 2019-11-27 DIAGNOSIS — Z23 Encounter for immunization: Secondary | ICD-10-CM | POA: Diagnosis not present

## 2019-11-27 DIAGNOSIS — N401 Enlarged prostate with lower urinary tract symptoms: Secondary | ICD-10-CM | POA: Diagnosis not present

## 2019-11-27 DIAGNOSIS — R5383 Other fatigue: Secondary | ICD-10-CM | POA: Diagnosis not present

## 2019-11-27 NOTE — Addendum Note (Signed)
Addended by: Reinaldo Meeker on: 11/27/2019 07:42 AM   Modules accepted: Orders

## 2019-11-28 LAB — TSH: TSH: 1.55 u[IU]/mL (ref 0.450–4.500)

## 2019-11-28 LAB — PSA: Prostate Specific Ag, Serum: 2.7 ng/mL (ref 0.0–4.0)

## 2019-11-28 NOTE — Progress Notes (Signed)
PSA 2.7 normal, TSH 1.55 normal lp

## 2020-01-27 ENCOUNTER — Ambulatory Visit (INDEPENDENT_AMBULATORY_CARE_PROVIDER_SITE_OTHER): Payer: 59

## 2020-01-27 ENCOUNTER — Other Ambulatory Visit: Payer: Self-pay

## 2020-01-27 DIAGNOSIS — Z23 Encounter for immunization: Secondary | ICD-10-CM

## 2020-03-25 ENCOUNTER — Ambulatory Visit: Payer: 59 | Admitting: Legal Medicine

## 2020-03-29 ENCOUNTER — Ambulatory Visit: Payer: 59 | Admitting: Legal Medicine

## 2020-03-29 ENCOUNTER — Other Ambulatory Visit: Payer: Self-pay

## 2020-03-29 ENCOUNTER — Encounter: Payer: Self-pay | Admitting: Legal Medicine

## 2020-03-29 VITALS — BP 120/80 | HR 70 | Temp 98.0°F | Resp 16 | Ht 69.5 in | Wt 243.2 lb

## 2020-03-29 DIAGNOSIS — N400 Enlarged prostate without lower urinary tract symptoms: Secondary | ICD-10-CM | POA: Diagnosis not present

## 2020-03-29 DIAGNOSIS — E782 Mixed hyperlipidemia: Secondary | ICD-10-CM | POA: Diagnosis not present

## 2020-03-29 DIAGNOSIS — N529 Male erectile dysfunction, unspecified: Secondary | ICD-10-CM

## 2020-03-29 DIAGNOSIS — I10 Essential (primary) hypertension: Secondary | ICD-10-CM | POA: Diagnosis not present

## 2020-03-29 DIAGNOSIS — G4733 Obstructive sleep apnea (adult) (pediatric): Secondary | ICD-10-CM

## 2020-03-29 MED ORDER — SILDENAFIL CITRATE 100 MG PO TABS: 50.0000 mg | ORAL_TABLET | Freq: Every day | ORAL | 6 refills | Status: DC | PRN

## 2020-03-29 MED ORDER — LISINOPRIL 20 MG PO TABS: 20.0000 mg | ORAL_TABLET | Freq: Every day | ORAL | 2 refills | Status: DC

## 2020-03-29 NOTE — Assessment & Plan Note (Signed)
regular use

## 2020-03-29 NOTE — Progress Notes (Signed)
Subjective:  Patient ID: Robert Mckinney, male    DOB: 19-Mar-1955  Age: 65 y.o. MRN: [id number]  Chief Complaint  Patient presents with  . Hypertension    HPI: Chronic visit  Patient presents for follow up of hypertension.  Patient tolerating lisinopril well with side effects.  Patient was diagnosed with hypertension 20 so has been treated for hypertension for 20 years.Patient is working on maintaining diet and exercise regimen and follows up as directed. Complication include none.  Patient presents with hyperlipidemia.  Compliance with treatment has been good; patient takes medicines as directed, maintains low cholesterol diet, follows up as directed, and maintains exercise regimen.  Patient is using crestor without problems.   Current Outpatient Medications on File Prior to Visit  Medication Sig Dispense Refill  . ALPRAZolam (XANAX) 0.5 MG tablet Take 1 tablet (0.5 mg total) by mouth at bedtime as needed. 30 tablet 3  . rosuvastatin (CRESTOR) 20 MG tablet TAKE 1 TABLET(20 MG) BY MOUTH EVERY DAY 90 tablet 2   No current facility-administered medications on file prior to visit.   Past Medical History:  Diagnosis Date  . Adjustment insomnia   . Benign essential hypertension   . Mixed hyperlipidemia   . Obstructive sleep apnea (adult) (pediatric)   . Other testicular dysfunction    Past Surgical History:  Procedure Laterality Date  . APPENDECTOMY    . KNEE SURGERY Right    2012  . TONSILLECTOMY      History reviewed. No pertinent family history. Social History   Socioeconomic History  . Marital status: Widowed    Spouse name: Not on file  . Number of children: 2  . Years of education: Not on file  . Highest education level: Not on file  Occupational History  . Occupation: Sales  Tobacco Use  . Smoking status: Never Smoker  . Smokeless tobacco: Never Used  Substance and Sexual Activity  . Alcohol use: Yes    Alcohol/week: 1.0 standard drink    Types: 1 Cans of beer per  week    Comment: Once a week  . Drug use: No  . Sexual activity: Yes    Partners: Female  Other Topics Concern  . Not on file  Social History Narrative  . Not on file   Social Determinants of Health   Financial Resource Strain: Not on file  Food Insecurity: Not on file  Transportation Needs: Not on file  Physical Activity: Not on file  Stress: Not on file  Social Connections: Not on file    Review of Systems  Constitutional: Negative for activity change and appetite change.  HENT: Negative for congestion and sinus pain.   Eyes: Negative for visual disturbance.  Respiratory: Negative for chest tightness and shortness of breath.   Cardiovascular: Negative for chest pain, palpitations and leg swelling.  Gastrointestinal: Negative for abdominal distention and abdominal pain.  Endocrine: Positive for polyuria.  Genitourinary: Negative for difficulty urinating, dysuria and urgency.  Musculoskeletal: Negative for arthralgias and back pain.  Skin: Negative.   Neurological: Negative.   Psychiatric/Behavioral: Negative.      Objective:  BP 120/80 (BP Location: Left Arm, Patient Position: Sitting, Cuff Size: Normal)   Pulse 70   Temp 98 F (36.7 C) (Temporal)   Resp 16   Ht 5' 9.5" (1.765 m)   Wt 243 lb 3.2 oz (110.3 kg)   SpO2 95%   BMI 35.40 kg/m   BP/Weight 03/29/2020 11/13/2019 123456  Systolic BP 123456 [id number] [id number]  Diastolic BP 80 84 76  Wt. (Lbs) 243.2 247 245  BMI 35.4 35.44 36.18    Physical Exam Vitals reviewed.  Constitutional:      Appearance: Normal appearance.  HENT:     Head: Atraumatic.     Right Ear: Tympanic membrane, ear canal and external ear normal.     Left Ear: Tympanic membrane and external ear normal.  Eyes:     Extraocular Movements: Extraocular movements intact.     Conjunctiva/sclera: Conjunctivae normal.     Pupils: Pupils are equal, round, and reactive to light.  Cardiovascular:     Rate and Rhythm: Normal rate and regular rhythm.      Pulses: Normal pulses.     Heart sounds: Normal heart sounds. No murmur heard. No gallop.   Pulmonary:     Effort: Pulmonary effort is normal. No respiratory distress.     Breath sounds: No wheezing.  Abdominal:     General: Abdomen is flat. Bowel sounds are normal. There is no distension.     Palpations: Abdomen is soft.     Tenderness: There is no abdominal tenderness.  Musculoskeletal:        General: No tenderness. Normal range of motion.     Cervical back: Normal range of motion and neck supple.  Skin:    General: Skin is warm and dry.     Capillary Refill: Capillary refill takes less than 2 seconds.  Neurological:     General: No focal deficit present.     Mental Status: He is alert and oriented to person, place, and time.  Psychiatric:        Mood and Affect: Mood normal.        Thought Content: Thought content normal.       Lab Results  Component Value Date   WBC 6.0 04/21/2019   HGB 15.5 04/21/2019   HCT 44.1 04/21/2019   PLT 160 04/21/2019   GLUCOSE 104 (H) 04/21/2019   LDLDIRECT 84 04/21/2019   ALT 27 04/21/2019   AST 18 04/21/2019   NA 140 04/21/2019   K 4.4 04/21/2019   CL 106 04/21/2019   CREATININE 1.10 04/21/2019   BUN 13 04/21/2019   CO2 19 (L) 04/21/2019   TSH 1.550 11/27/2019   INR 0.98 12/13/2009      Assessment & Plan:  Diagnoses and all orders for this visit: Benign essential hypertension -     CBC with Differential/Platelet -     Comprehensive metabolic panel An individual hypertension care plan was established and reinforced today.  The patient's status was assessed using clinical findings on exam and labs or diagnostic tests. The patient's success at meeting treatment goals on disease specific evidence-based guidelines and found to be well controlled. SELF MANAGEMENT: The patient and I together assessed ways to personally work towards obtaining the recommended goals. RECOMMENDATIONS: avoid decongestants found in common cold remedies,  decrease consumption of alcohol, perform routine monitoring of BP with home BP cuff, exercise, reduction of dietary salt, take medicines as prescribed, try not to miss doses and quit smoking.  Regular exercise and maintaining a healthy weight is needed.  Stress reduction may help. A CLINICAL SUMMARY including written plan identify barriers to care unique to individual due to social or financial issues.  We attempt to mutually creat solutions for individual and family understanding. Obstructive sleep apnea (adult) (pediatric) He is compliant with CPAP every night  Mixed hyperlipidemia -     Lipid panel AN INDIVIDUAL CARE PLAN  for hyperlipidemia/ cholesterol was established and reinforced today.  The patient's status was assessed using clinical findings on exam, lab and other diagnostic tests. The patient's disease status was assessed based on evidence-based guidelines and found to be well controlled. MEDICATIONS were reviewed. SELF MANAGEMENT GOALS have been discussed and patient's success at attaining the goal of low cholesterol was assessed. RECOMMENDATION given include regular exercise 3 days a week and low cholesterol/low fat diet. CLINICAL SUMMARY including written plan to identify barriers unique to the patient due to social or economic  reasons was discussed.  Benign prostatic hyperplasia without lower urinary tract symptoms -     PSA AN INDIVIDUAL CARE PLAN for BPH was established and reinforced today.  The patient's status was assessed using clinical findings on exam, labs, and other diagnostic testing. Patient's success at meeting treatment goals based on disease specific evidence-bassed guidelines and found to be in good control. RECOMMENDATIONS include maintaining present medicines and treatment.  Essential hypertension, benign -     lisinopril (ZESTRIL) 20 MG tablet; Take 1 tablet (20 mg total) by mouth daily. An individual hypertension care plan was established and reinforced today.   The patient's status was assessed using clinical findings on exam and labs or diagnostic tests. The patient's success at meeting treatment goals on disease specific evidence-based guidelines and found to be well controlled. SELF MANAGEMENT: The patient and I together assessed ways to personally work towards obtaining the recommended goals. RECOMMENDATIONS: avoid decongestants found in common cold remedies, decrease consumption of alcohol, perform routine monitoring of BP with home BP cuff, exercise, reduction of dietary salt, take medicines as prescribed, try not to miss doses and quit smoking.  Regular exercise and maintaining a healthy weight is needed.  Stress reduction may help. A CLINICAL SUMMARY including written plan identify barriers to care unique to individual due to social or financial issues.  We attempt to mutually creat solutions for individual and family understanding. Vasculogenic erectile dysfunction, unspecified vasculogenic erectile dysfunction type -     sildenafil (VIAGRA) 100 MG tablet; Take 0.5-1 tablets (50-100 mg total) by mouth daily as needed for erectile dysfunction. AN INDIVIDUAL CARE PLAN for ED was established and reinforced today.  The patient's status was assessed using clinical findings on exam, labs, and other diagnostic testing. Patient's success at meeting treatment goals based on disease specific evidence-bassed guidelines and found to be in good control. RECOMMENDATIONS include maintining present medicines and treatment.         I spent 20 minutes dedicated to the care of this patient on the date of this encounter to include face-to-face time with the patient, as well as:   Follow-up: Return in about 6 months (around 09/26/2020), or fasting.  An After Visit Summary was printed and given to the patient.  Reinaldo Meeker, MD [facility] [phone]

## 2020-07-15 ENCOUNTER — Other Ambulatory Visit: Payer: Self-pay

## 2020-07-15 DIAGNOSIS — N529 Male erectile dysfunction, unspecified: Secondary | ICD-10-CM

## 2020-07-15 DIAGNOSIS — I10 Essential (primary) hypertension: Secondary | ICD-10-CM

## 2020-07-15 DIAGNOSIS — E782 Mixed hyperlipidemia: Secondary | ICD-10-CM

## 2020-07-15 MED ORDER — SILDENAFIL CITRATE 100 MG PO TABS: 50.0000 mg | ORAL_TABLET | Freq: Every day | ORAL | 6 refills | Status: DC | PRN

## 2020-07-15 MED ORDER — ROSUVASTATIN CALCIUM 20 MG PO TABS
ORAL_TABLET | ORAL | 2 refills | Status: DC
Start: 2020-07-15 — End: 2021-04-15

## 2020-07-15 MED ORDER — LISINOPRIL 20 MG PO TABS
20.0000 mg | ORAL_TABLET | Freq: Every day | ORAL | 2 refills | Status: DC
Start: 2020-07-15 — End: 2020-12-24

## 2020-07-15 NOTE — Telephone Encounter (Signed)
Switching to new pharmacy.   Robert Mckinney, [place] 07/15/20 10:36 AM

## 2020-09-20 ENCOUNTER — Ambulatory Visit (INDEPENDENT_AMBULATORY_CARE_PROVIDER_SITE_OTHER): Payer: 59 | Admitting: Legal Medicine

## 2020-09-20 ENCOUNTER — Encounter: Payer: Self-pay | Admitting: Legal Medicine

## 2020-09-20 ENCOUNTER — Other Ambulatory Visit: Payer: Self-pay

## 2020-09-20 VITALS — BP 130/70 | HR 64 | Temp 98.3°F | Resp 16 | Ht 69.5 in | Wt 241.0 lb

## 2020-09-20 DIAGNOSIS — N400 Enlarged prostate without lower urinary tract symptoms: Secondary | ICD-10-CM | POA: Diagnosis not present

## 2020-09-20 DIAGNOSIS — E66812 Obesity, class 2: Secondary | ICD-10-CM | POA: Insufficient documentation

## 2020-09-20 DIAGNOSIS — E298 Other testicular dysfunction: Secondary | ICD-10-CM | POA: Diagnosis not present

## 2020-09-20 DIAGNOSIS — I10 Essential (primary) hypertension: Secondary | ICD-10-CM | POA: Diagnosis not present

## 2020-09-20 DIAGNOSIS — G4733 Obstructive sleep apnea (adult) (pediatric): Secondary | ICD-10-CM

## 2020-09-20 DIAGNOSIS — Z6834 Body mass index (BMI) 34.0-34.9, adult: Secondary | ICD-10-CM | POA: Insufficient documentation

## 2020-09-20 DIAGNOSIS — E782 Mixed hyperlipidemia: Secondary | ICD-10-CM

## 2020-09-20 DIAGNOSIS — Z6835 Body mass index (BMI) 35.0-35.9, adult: Secondary | ICD-10-CM | POA: Insufficient documentation

## 2020-09-20 NOTE — Progress Notes (Signed)
Acute Office Visit  Subjective:    Patient ID: Robert Mckinney, male    DOB: 02-Nov-1955, 65 y.o.   MRN: [id number]  Chief Complaint  Patient presents with   Benign Prostatic Hypertrophy   Hypertension     HPI Patient is in today for chronic visit  Patient presents for follow up of hypertension.  Patient tolerating lisinopril well with side effects.  Patient was diagnosed with hypertension 2010 so has been treated for hypertension for 10 years.Patient is working on maintaining diet and exercise regimen and follows up as directed. Complication include none.       Past Medical History:  Diagnosis Date   Adjustment insomnia    Benign essential hypertension    Mixed hyperlipidemia    Obstructive sleep apnea (adult) (pediatric)    Other testicular dysfunction     Past Surgical History:  Procedure Laterality Date   APPENDECTOMY     KNEE SURGERY Right    2012   TONSILLECTOMY      History reviewed. No pertinent family history.  Social History   Socioeconomic History   Marital status: Widowed    Spouse name: Not on file   Number of children: 2   Years of education: Not on file   Highest education level: Not on file  Occupational History   Occupation: Sales  Tobacco Use   Smoking status: Never   Smokeless tobacco: Never  Substance and Sexual Activity   Alcohol use: Yes    Alcohol/week: 1.0 standard drink    Types: 1 Cans of beer per week    Comment: Once a week   Drug use: No   Sexual activity: Yes    Partners: Female  Other Topics Concern   Not on file  Social History Narrative   Not on file   Social Determinants of Health   Financial Resource Strain: Not on file  Food Insecurity: Not on file  Transportation Needs: Not on file  Physical Activity: Not on file  Stress: Not on file  Social Connections: Not on file  Intimate Partner Violence: Not on file    Outpatient Medications Prior to Visit  Medication Sig Dispense Refill   ALPRAZolam (XANAX) 0.5 MG  tablet Take 1 tablet (0.5 mg total) by mouth at bedtime as needed. 30 tablet 3   lisinopril (ZESTRIL) 20 MG tablet Take 1 tablet (20 mg total) by mouth daily. 90 tablet 2   rosuvastatin (CRESTOR) 20 MG tablet TAKE 1 TABLET(20 MG) BY MOUTH EVERY DAY 90 tablet 2   sildenafil (VIAGRA) 100 MG tablet Take 0.5-1 tablets (50-100 mg total) by mouth daily as needed for erectile dysfunction. 10 tablet 6   No facility-administered medications prior to visit.    No Known Allergies  Review of Systems  Constitutional:  Negative for chills, fatigue and fever.  HENT:  Negative for congestion, ear pain and sore throat.   Respiratory:  Negative for cough and shortness of breath.   Cardiovascular:  Negative for chest pain.  Gastrointestinal:  Negative for abdominal pain, constipation, diarrhea, nausea and vomiting.  Endocrine: Negative for polydipsia, polyphagia and polyuria.  Genitourinary:  Negative for dysuria and frequency.  Musculoskeletal:  Negative for arthralgias and myalgias.  Neurological:  Negative for dizziness and headaches.  Psychiatric/Behavioral:  Negative for dysphoric mood.        No dysphoria      Objective:    Physical Exam Vitals reviewed.  Constitutional:      Appearance: Normal appearance. He is obese.  HENT:     Right Ear: Tympanic membrane, ear canal and external ear normal.     Left Ear: Tympanic membrane, ear canal and external ear normal.     Nose: Nose normal.     Mouth/Throat:     Mouth: Mucous membranes are moist.     Pharynx: Oropharynx is clear.  Eyes:     Conjunctiva/sclera: Conjunctivae normal.     Pupils: Pupils are equal, round, and reactive to light.  Cardiovascular:     Rate and Rhythm: Normal rate and regular rhythm.     Pulses: Normal pulses.     Heart sounds: Normal heart sounds.  Pulmonary:     Effort: Pulmonary effort is normal. No respiratory distress.     Breath sounds: Normal breath sounds. No wheezing.  Abdominal:     General: Abdomen is  flat. Bowel sounds are normal.  Musculoskeletal:        General: Normal range of motion.     Right lower leg: No edema.     Left lower leg: No edema.  Skin:    General: Skin is warm.     Capillary Refill: Capillary refill takes less than 2 seconds.  Neurological:     General: No focal deficit present.     Mental Status: He is alert and oriented to person, place, and time. Mental status is at baseline.  Psychiatric:        Mood and Affect: Mood normal.        Thought Content: Thought content normal.        Judgment: Judgment normal.    BP 130/70   Pulse 64   Temp 98.3 F (36.8 C)   Resp 16   Ht 5' 9.5" (1.765 m)   Wt 241 lb (109.3 kg)   BMI 35.08 kg/m  Wt Readings from Last 3 Encounters:  09/20/20 241 lb (109.3 kg)  03/29/20 243 lb 3.2 oz (110.3 kg)  11/13/19 247 lb (112 kg)    Health Maintenance Due  Topic Date Due   HIV Screening  Never done   Hepatitis C Screening  Never done   COLONOSCOPY (Pts 45-44yrs Insurance coverage will need to be confirmed)  Never done   COVID-19 Vaccine (3 - Booster for [place] series) 10/09/2019   PNA vac Low Risk Adult (1 of 2 - PCV13) 08/26/2020    There are no preventive care reminders to display for this patient.   Lab Results  Component Value Date   TSH 1.550 11/27/2019   Lab Results  Component Value Date   WBC 6.0 04/21/2019   HGB 15.5 04/21/2019   HCT 44.1 04/21/2019   MCV 91 04/21/2019   PLT 160 04/21/2019   Lab Results  Component Value Date   NA 140 04/21/2019   K 4.4 04/21/2019   CO2 19 (L) 04/21/2019   GLUCOSE 104 (H) 04/21/2019   BUN 13 04/21/2019   CREATININE 1.10 04/21/2019   BILITOT 0.7 04/21/2019   ALKPHOS 108 04/21/2019   AST 18 04/21/2019   ALT 27 04/21/2019   PROT 6.6 04/21/2019   ALBUMIN 4.4 04/21/2019   CALCIUM 10.7 (H) 04/21/2019   No results found for: CHOL No results found for: HDL No results found for: LDLCALC No results found for: TRIG No results found for: CHOLHDL No results found for:  HGBA1C     Assessment & Plan:  1. Benign prostatic hyperplasia without lower urinary tract symptoms - PSA AN INDIVIDUAL CARE PLAN for BPH was established and reinforced  today.  The patient's status was assessed using clinical findings on exam, labs, and other diagnostic testing. Patient's success at meeting treatment goals based on disease specific evidence-bassed guidelines and found to be in good control. RECOMMENDATIONS include maintaining present medicines and treatment.   2. Benign essential hypertension - CBC with Differential/Platelet - Comprehensive metabolic panel An individual hypertension care plan was established and reinforced today.  The patient's status was assessed using clinical findings on exam and labs or diagnostic tests. The patient's success at meeting treatment goals on disease specific evidence-based guidelines and found to be well controlled. SELF MANAGEMENT: The patient and I together assessed ways to personally work towards obtaining the recommended goals. RECOMMENDATIONS: avoid decongestants found in common cold remedies, decrease consumption of alcohol, perform routine monitoring of BP with home BP cuff, exercise, reduction of dietary salt, take medicines as prescribed, try not to miss doses and quit smoking.  Regular exercise and maintaining a healthy weight is needed.  Stress reduction may help. A CLINICAL SUMMARY including written plan identify barriers to care unique to individual due to social or financial issues.  We attempt to mutually creat solutions for individual and family understanding.   3. OSA (obstructive sleep apnea) Using CPAP consistently every night and medically benefiting from its use.   4. Other testicular dysfunction AN INDIVIDUAL CARE PLAN hypogonadism was established and reinforced today.  The patient's status was assessed using clinical findings on exam, labs, and other diagnostic testing. Patient's success at meeting treatment goals based  on disease specific evidence-bassed guidelines and found to be in good control. RECOMMENDATIONS include maintaining present medicines and treatment.   5. Mixed hyperlipidemia - Lipid panel AN INDIVIDUAL CARE PLAN for hyperlipidemia/ cholesterol was established and reinforced today.  The patient's status was assessed using clinical findings on exam, lab and other diagnostic tests. The patient's disease status was assessed based on evidence-based guidelines and found to be fair controlled. MEDICATIONS were reviewed. SELF MANAGEMENT GOALS have been discussed and patient's success at attaining the goal of low cholesterol was assessed. RECOMMENDATION given include regular exercise 3 days a week and low cholesterol/low fat diet. CLINICAL SUMMARY including written plan to identify barriers unique to the patient due to social or economic  reasons was discussed.   6. BMI 35.0-35.9,adult An individualize plan was formulated for obesity using patient history and physical exam to encourage weight loss.  An evidence based program was formulated.  Patient is to cut portion size with meals and to plan physical exercise 3 days a week at least 20 minutes.  Weight watchers and other programs are helpful.  Planned amount of weight loss 10 lbs. He meets criteria for morbid obesity with hypertension and hypercholesterolemia.  7. Morbid obesity ([place]) An individualize plan was formulated for obesity using patient history and physical exam to encourage weight loss.  An evidence based program was formulated.  Patient  to cut portion size with meals and to plan physical exercise 3 days a week at least 20 minutes.  Weight watchers and other programs are helpful.  Planned amount of weight loss 10 lbs.        Orders Placed This Encounter  Procedures   CBC with Differential/Platelet   Comprehensive metabolic panel   Lipid panel   PSA      I spent 28minutes dedicated to the care of this patient on the date of this  encounter to include face-to-face time with the patient, as well as:   Follow-up: Return in about 6  months (around 03/23/2021) for fasting.  An After Visit Summary was printed and given to the patient.  Reinaldo Meeker, MD [facility] [phone]

## 2020-09-21 ENCOUNTER — Telehealth: Payer: Self-pay

## 2020-09-21 LAB — COMPREHENSIVE METABOLIC PANEL
ALT: 19 IU/L (ref 0–44)
AST: 16 IU/L (ref 0–40)
Albumin/Globulin Ratio: 2.8 — ABNORMAL HIGH (ref 1.2–2.2)
Albumin: 4.7 g/dL (ref 3.8–4.8)
Alkaline Phosphatase: 57 IU/L (ref 44–121)
BUN/Creatinine Ratio: 8 — ABNORMAL LOW (ref 10–24)
BUN: 11 mg/dL (ref 8–27)
Bilirubin Total: 1.2 mg/dL (ref 0.0–1.2)
CO2: 23 mmol/L (ref 20–29)
Calcium: 10.6 mg/dL — ABNORMAL HIGH (ref 8.6–10.2)
Chloride: 105 mmol/L (ref 96–106)
Creatinine, Ser: 1.39 mg/dL — ABNORMAL HIGH (ref 0.76–1.27)
Globulin, Total: 1.7 g/dL (ref 1.5–4.5)
Glucose: 91 mg/dL (ref 65–99)
Potassium: 4.7 mmol/L (ref 3.5–5.2)
Sodium: 142 mmol/L (ref 134–144)
Total Protein: 6.4 g/dL (ref 6.0–8.5)
eGFR: 56 mL/min/{1.73_m2} — ABNORMAL LOW (ref >59)

## 2020-09-21 LAB — PSA: Prostate Specific Ag, Serum: 3.3 ng/mL (ref 0.0–4.0)

## 2020-09-21 LAB — LIPID PANEL
Chol/HDL Ratio: 4 ratio (ref 0.0–5.0)
Cholesterol, Total: 127 mg/dL (ref 100–199)
HDL: 32 mg/dL — ABNORMAL LOW (ref >39)
LDL Chol Calc (NIH): 71 mg/dL (ref 0–99)
Triglycerides: 133 mg/dL (ref 0–149)
VLDL Cholesterol Cal: 24 mg/dL (ref 5–40)

## 2020-09-21 LAB — CBC WITH DIFFERENTIAL/PLATELET
Basophils Absolute: 0 10*3/uL (ref 0.0–0.2)
Basos: 1 %
EOS (ABSOLUTE): 0.2 10*3/uL (ref 0.0–0.4)
Eos: 4 %
Hematocrit: 48.3 % (ref 37.5–51.0)
Hemoglobin: 16.1 g/dL (ref 13.0–17.7)
Immature Grans (Abs): 0 10*3/uL (ref 0.0–0.1)
Immature Granulocytes: 1 %
Lymphocytes Absolute: 1.8 10*3/uL (ref 0.7–3.1)
Lymphs: 27 %
MCH: 30.9 pg (ref 26.6–33.0)
MCHC: 33.3 g/dL (ref 31.5–35.7)
MCV: 93 fL (ref 79–97)
Monocytes Absolute: 0.8 10*3/uL (ref 0.1–0.9)
Monocytes: 12 %
Neutrophils Absolute: 3.8 10*3/uL (ref 1.4–7.0)
Neutrophils: 55 %
Platelets: 158 10*3/uL (ref 150–450)
RBC: 5.21 x10E6/uL (ref 4.14–5.80)
RDW: 13.6 % (ref 11.6–15.4)
WBC: 6.6 10*3/uL (ref 3.4–10.8)

## 2020-09-21 LAB — CARDIOVASCULAR RISK ASSESSMENT

## 2020-09-21 NOTE — Telephone Encounter (Addendum)
Pt returned call. Pt states he did not understand what was abnormal on labwork and wanted explanation. Once it was explained pt was agreeable to come back for lab work. Pt agreed to lab appointment on Monday.   Order for parathyroid hormone placed.   Royce Macadamia, [place] 09/21/20 11:22 AM

## 2020-09-21 NOTE — Progress Notes (Signed)
CBC normal, kidney tests remain 3a, calcium 10.6, add parathyroid hormone test, cholesterol normal, PSA 3.3 normal lp

## 2020-09-27 ENCOUNTER — Other Ambulatory Visit: Payer: Self-pay

## 2020-09-27 ENCOUNTER — Other Ambulatory Visit: Payer: 59

## 2020-09-28 LAB — PARATHYROID HORMONE, INTACT (NO CA): PTH: 34 pg/mL (ref 15–65)

## 2020-09-28 NOTE — Progress Notes (Signed)
PTH 34 normal no hyperparathyroidism causing hypercalcemia lp

## 2020-11-18 ENCOUNTER — Encounter: Payer: 59 | Admitting: Legal Medicine

## 2020-11-24 ENCOUNTER — Other Ambulatory Visit: Payer: Self-pay

## 2020-11-24 ENCOUNTER — Encounter: Payer: Self-pay | Admitting: Legal Medicine

## 2020-11-24 ENCOUNTER — Ambulatory Visit (INDEPENDENT_AMBULATORY_CARE_PROVIDER_SITE_OTHER): Payer: 59 | Admitting: Legal Medicine

## 2020-11-24 VITALS — BP 110/80 | HR 59 | Temp 97.6°F | Resp 16 | Ht 69.5 in | Wt 236.0 lb

## 2020-11-24 DIAGNOSIS — Z0000 Encounter for general adult medical examination without abnormal findings: Secondary | ICD-10-CM | POA: Diagnosis not present

## 2020-11-24 DIAGNOSIS — Z23 Encounter for immunization: Secondary | ICD-10-CM

## 2020-11-24 NOTE — Patient Instructions (Signed)
Preventive Care 40-64 Years Old, Male Preventive care refers to lifestyle choices and visits with your health care provider that can promote health and wellness. This includes: A yearly physical exam. This is also called an annual wellness visit. Regular dental and eye exams. Immunizations. Screening for certain conditions. Healthy lifestyle choices, such as: Eating a healthy diet. Getting regular exercise. Not using drugs or products that contain nicotine and tobacco. Limiting alcohol use. What can I expect for my preventive care visit? Physical exam Your health care provider will check your: Height and weight. These may be used to calculate your BMI (body mass index). BMI is a measurement that tells if you are at a healthy weight. Heart rate and blood pressure. Body temperature. Skin for abnormal spots. Counseling Your health care provider may ask you questions about your: Past medical problems. Family's medical history. Alcohol, tobacco, and drug use. Emotional well-being. Home life and relationship well-being. Sexual activity. Diet, exercise, and sleep habits. Work and work environment. Access to firearms. What immunizations do I need? Vaccines are usually given at various ages, according to a schedule. Your health care provider will recommend vaccines for you based on your age, medical history, and lifestyle or other factors, such as travel or where you work. What tests do I need? Blood tests Lipid and cholesterol levels. These may be checked every 5 years, or more often if you are over 50 years old. Hepatitis C test. Hepatitis B test. Screening Lung cancer screening. You may have this screening every year starting at age 55 if you have a 30-pack-year history of smoking and currently smoke or have quit within the past 15 years. Prostate cancer screening. Recommendations will vary depending on your family history and other risks. Genital exam to check for testicular cancer  or hernias. Colorectal cancer screening. All adults should have this screening starting at age 50 and continuing until age 75. Your health care provider may recommend screening at age 45 if you are at increased risk. You will have tests every 1-10 years, depending on your results and the type of screening test. Diabetes screening. This is done by checking your blood sugar (glucose) after you have not eaten for a while (fasting). You may have this done every 1-3 years. STD (sexually transmitted disease) testing, if you are at risk. Follow these instructions at home: Eating and drinking  Eat a diet that includes fresh fruits and vegetables, whole grains, lean protein, and low-fat dairy products. Take vitamin and mineral supplements as recommended by your health care provider. Do not drink alcohol if your health care provider tells you not to drink. If you drink alcohol: Limit how much you have to 0-2 drinks a day. Be aware of how much alcohol is in your drink. In the [place] one drink equals one 12 oz bottle of beer (355 mL), one 5 oz glass of wine (148 mL), or one 1 oz glass of hard liquor (44 mL). Lifestyle Take daily care of your teeth and gums. Brush your teeth every morning and night with fluoride toothpaste. Floss one time each day. Stay active. Exercise for at least 30 minutes 5 or more days each week. Do not use any products that contain nicotine or tobacco, such as cigarettes, e-cigarettes, and chewing tobacco. If you need help quitting, ask your health care provider. Do not use drugs. If you are sexually active, practice safe sex. Use a condom or other form of protection to prevent STIs (sexually transmitted infections). If told by your   health care provider, take low-dose aspirin daily starting at age 50. Find healthy ways to cope with stress, such as: Meditation, yoga, or listening to music. Journaling. Talking to a trusted person. Spending time with friends and  family. Safety Always wear your seat belt while driving or riding in a vehicle. Do not drive: If you have been drinking alcohol. Do not ride with someone who has been drinking. When you are tired or distracted. While texting. Wear a helmet and other protective equipment during sports activities. If you have firearms in your house, make sure you follow all gun safety procedures. What's next? Go to your health care provider once a year for an annual wellness visit. Ask your health care provider how often you should have your eyes and teeth checked. Stay up to date on all vaccines. This information is not intended to replace advice given to you by your health care provider. Make sure you discuss any questions you have with your health care provider. Document Revised: 04/30/2020 Document Reviewed: 02/14/2018 Elsevier Patient Education  2022 Elsevier Inc.   

## 2020-11-24 NOTE — Progress Notes (Signed)
Established Patient Office Visit  Subjective:  Patient ID: Robert Mckinney, male    DOB: December 29, 1955  Age: 65 y.o. MRN: [id number]  CC:  Chief Complaint  Patient presents with   Annual Exam    HPI Robert Mckinney presents for work physical.  He is having no problems, he has hypertension and hypercholesterolemia.  No complaints  Past Medical History:  Diagnosis Date   Adjustment insomnia    Benign essential hypertension    Mixed hyperlipidemia    Obstructive sleep apnea (adult) (pediatric)    Other testicular dysfunction     Past Surgical History:  Procedure Laterality Date   APPENDECTOMY     KNEE SURGERY Right    2012   TONSILLECTOMY      History reviewed. No pertinent family history.  Social History   Socioeconomic History   Marital status: Widowed    Spouse name: Not on file   Number of children: 2   Years of education: Not on file   Highest education level: Not on file  Occupational History   Occupation: Sales  Tobacco Use   Smoking status: Never   Smokeless tobacco: Never  Substance and Sexual Activity   Alcohol use: Yes    Alcohol/week: 1.0 standard drink    Types: 1 Cans of beer per week    Comment: Once a week   Drug use: No   Sexual activity: Yes    Partners: Female  Other Topics Concern   Not on file  Social History Narrative   Not on file   Social Determinants of Health   Financial Resource Strain: Not on file  Food Insecurity: Not on file  Transportation Needs: Not on file  Physical Activity: Not on file  Stress: Not on file  Social Connections: Not on file  Intimate Partner Violence: Not on file    Outpatient Medications Prior to Visit  Medication Sig Dispense Refill   ALPRAZolam (XANAX) 0.5 MG tablet Take 1 tablet (0.5 mg total) by mouth at bedtime as needed. 30 tablet 3   lisinopril (ZESTRIL) 20 MG tablet Take 1 tablet (20 mg total) by mouth daily. 90 tablet 2   rosuvastatin (CRESTOR) 20 MG tablet TAKE 1 TABLET(20 MG) BY MOUTH EVERY DAY  90 tablet 2   sildenafil (VIAGRA) 100 MG tablet Take 0.5-1 tablets (50-100 mg total) by mouth daily as needed for erectile dysfunction. 10 tablet 6   No facility-administered medications prior to visit.    No Known Allergies  ROS Review of Systems  Constitutional:  Negative for chills, fatigue and fever.  HENT:  Negative for congestion, ear pain and sore throat.   Respiratory:  Negative for cough and shortness of breath.   Cardiovascular:  Negative for chest pain.  Gastrointestinal:  Negative for abdominal pain, constipation, diarrhea, nausea and vomiting.  Endocrine: Negative for polydipsia, polyphagia and polyuria.  Genitourinary:  Negative for dysuria and frequency.  Musculoskeletal:  Negative for arthralgias and myalgias.  Neurological:  Negative for dizziness and headaches.  Psychiatric/Behavioral:  Negative for dysphoric mood.        No dysphoria     Objective:    Physical Exam Vitals reviewed.  Constitutional:      General: He is not in acute distress.    Appearance: Normal appearance. He is obese.  HENT:     Head: Normocephalic.     Right Ear: Tympanic membrane, ear canal and external ear normal.     Left Ear: Tympanic membrane, ear canal and  external ear normal.     Mouth/Throat:     Mouth: Mucous membranes are moist.     Pharynx: Oropharynx is clear.  Eyes:     Extraocular Movements: Extraocular movements intact.     Conjunctiva/sclera: Conjunctivae normal.     Pupils: Pupils are equal, round, and reactive to light.  Cardiovascular:     Rate and Rhythm: Normal rate and regular rhythm.     Pulses: Normal pulses.     Heart sounds: Normal heart sounds. No murmur heard.   No gallop.  Pulmonary:     Effort: Pulmonary effort is normal.     Breath sounds: Normal breath sounds.  Abdominal:     General: Abdomen is flat. Bowel sounds are normal. There is no distension.     Palpations: Abdomen is soft.     Tenderness: There is no abdominal tenderness.   Genitourinary:    Prostate: Normal.  Musculoskeletal:        General: Normal range of motion.     Cervical back: Normal range of motion and neck supple.     Right lower leg: No edema.     Left lower leg: No edema.  Skin:    General: Skin is warm.     Capillary Refill: Capillary refill takes less than 2 seconds.  Neurological:     General: No focal deficit present.     Mental Status: He is alert and oriented to person, place, and time. Mental status is at baseline.     Gait: Gait normal.     Deep Tendon Reflexes: Reflexes normal.  Psychiatric:        Mood and Affect: Mood normal.        Thought Content: Thought content normal.        Judgment: Judgment normal.    BP 110/80   Pulse (!) 59   Temp 97.6 F (36.4 C)   Resp 16   Ht 5' 9.5" (1.765 m)   Wt 236 lb (107 kg)   SpO2 97%   BMI 34.35 kg/m  Wt Readings from Last 3 Encounters:  11/24/20 236 lb (107 kg)  09/20/20 241 lb (109.3 kg)  03/29/20 243 lb 3.2 oz (110.3 kg)     Health Maintenance Due  Topic Date Due   HIV Screening  Never done   Hepatitis C Screening  Never done   COLONOSCOPY (Pts 45-11yr Insurance coverage will need to be confirmed)  Never done   COVID-19 Vaccine (3 - Booster for PCatalinaseries) 10/09/2019    There are no preventive care reminders to display for this patient.  Lab Results  Component Value Date   TSH 1.550 11/27/2019   Lab Results  Component Value Date   WBC 6.6 09/20/2020   HGB 16.1 09/20/2020   HCT 48.3 09/20/2020   MCV 93 09/20/2020   PLT 158 09/20/2020   Lab Results  Component Value Date   NA 142 09/20/2020   K 4.7 09/20/2020   CO2 23 09/20/2020   GLUCOSE 91 09/20/2020   BUN 11 09/20/2020   CREATININE 1.39 (H) 09/20/2020   BILITOT 1.2 09/20/2020   ALKPHOS 57 09/20/2020   AST 16 09/20/2020   ALT 19 09/20/2020   PROT 6.4 09/20/2020   ALBUMIN 4.7 09/20/2020   CALCIUM 10.6 (H) 09/20/2020   EGFR 56 (L) 09/20/2020   Lab Results  Component Value Date   CHOL 127  09/20/2020   Lab Results  Component Value Date   HDL 32 (L) 09/20/2020  Lab Results  Component Value Date   LDLCALC 71 09/20/2020   Lab Results  Component Value Date   TRIG 133 09/20/2020   Lab Results  Component Value Date   CHOLHDL 4.0 09/20/2020   No results found for: HGBA1C    Assessment & Plan:   Diagnoses and all orders for this visit: Need for vaccination -     Flu Vaccine QUAD High Dose(Fluad) Routine general medical examination at a health care facility  Routine PE and wellness counseling    Follow-up: Return if symptoms worsen or fail to improve.    Reinaldo Meeker, MD

## 2020-11-30 ENCOUNTER — Other Ambulatory Visit: Payer: Self-pay | Admitting: Legal Medicine

## 2020-11-30 ENCOUNTER — Telehealth: Payer: Self-pay

## 2020-11-30 DIAGNOSIS — F5102 Adjustment insomnia: Secondary | ICD-10-CM

## 2020-11-30 MED ORDER — TRAZODONE HCL 150 MG PO TABS: 150.0000 mg | ORAL_TABLET | Freq: Every day | ORAL | 2 refills | Status: DC

## 2020-11-30 NOTE — Telephone Encounter (Signed)
I called in trazodone 150mg - it is not habit forming lp

## 2020-11-30 NOTE — Telephone Encounter (Signed)
Patient called stated he is current on xanax to help him sleep and wanted to know is there something else he can take to sleep better at night, The xanax is working but he don't want to get addicted to the xanax, Wanted to know can you put him on something that will work like the xanax that is less addictive.

## 2020-12-01 NOTE — Telephone Encounter (Signed)
Patient made aware of information, verbalized understanding. 

## 2020-12-23 ENCOUNTER — Ambulatory Visit: Payer: 59 | Admitting: Internal Medicine

## 2020-12-23 ENCOUNTER — Encounter: Payer: Self-pay | Admitting: Internal Medicine

## 2020-12-23 ENCOUNTER — Other Ambulatory Visit: Payer: Self-pay

## 2020-12-23 VITALS — BP 130/76 | HR 56 | Temp 98.3°F | Ht 69.5 in | Wt 237.6 lb

## 2020-12-23 DIAGNOSIS — J387 Other diseases of larynx: Secondary | ICD-10-CM

## 2020-12-23 DIAGNOSIS — R053 Chronic cough: Secondary | ICD-10-CM

## 2020-12-23 LAB — CBC WITH DIFFERENTIAL/PLATELET
Basophils Absolute: 0 10*3/uL (ref 0.0–0.1)
Basophils Relative: 0.5 % (ref 0.0–3.0)
Eosinophils Absolute: 0.2 10*3/uL (ref 0.0–0.7)
Eosinophils Relative: 2.7 % (ref 0.0–5.0)
HCT: 50.3 % (ref 39.0–52.0)
Hemoglobin: 17.4 g/dL — ABNORMAL HIGH (ref 13.0–17.0)
Lymphocytes Relative: 24.4 % (ref 12.0–46.0)
Lymphs Abs: 1.8 10*3/uL (ref 0.7–4.0)
MCHC: 34.6 g/dL (ref 30.0–36.0)
MCV: 90.7 fl (ref 78.0–100.0)
Monocytes Absolute: 0.7 10*3/uL (ref 0.1–1.0)
Monocytes Relative: 9.5 % (ref 3.0–12.0)
Neutro Abs: 4.7 10*3/uL (ref 1.4–7.7)
Neutrophils Relative %: 62.9 % (ref 43.0–77.0)
Platelets: 176 10*3/uL (ref 150.0–400.0)
RBC: 5.55 Mil/uL (ref 4.22–5.81)
RDW: 14 % (ref 11.5–15.5)
WBC: 7.4 10*3/uL (ref 4.0–10.5)

## 2020-12-23 NOTE — Progress Notes (Signed)
OV 12/23/2020  Subjective:  Patient ID: Robert Mckinney, male , DOB: 10-17-1955 , age 65 y.o. , MRN: [id number] , ADDRESS: [address] [place] [address] PCP Robert Anes, MD Patient Care Team: Robert Anes, MD as PCP - General (Family Medicine)  This Provider for this visit: Treatment Team:  Attending Provider: Brand Males, MD    12/23/2020 -   Chief Complaint  Patient presents with   Consult    Pt has had a cough for more than 12 years which he states comes and goes. States his cough is worse when he is talking as he has problems with dry throat and also has problems with cold weather.     HPI Robert Mckinney 65 y.o. -accompanied by wife.  He is a son-in-law of my former patient Mr. Robert Mckinney who died from IPF.  He is a chronic cough for over 10 years.  He tells me that many years ago he was diagnosed with hypertension and put on lisinopril and that is when cough started but around 2 years ago when his first wife had cancer he had stopped his lisinopril for 2 years and then restarted it approximately 2 years ago.  Stopping the lisinopril did not make any impact on his cough.  The cough is continued.  He did have COVID in September 2022 b.  He says that periodically when the cough just gets worse he will take amoxicillin and it temporarily helps.  He also states the cough is the almost every day on and off.  RSI cough score is 13.  The COVID did increase the cough.\And that is why he is here.  The cough is made worse by talking a lot [is a salesman for cleaning products].  Also cold air and salty food makes the cough worse.  Cough is worse in the day overnight but sometimes there at night.  There is no wheezing.  He feels his throat is dry and also parched.  He feels sometimes food gets stuck in his throat.  There is no wheezing.  He used to have heartburn but he started taking Prilosec and this is resolved but did not make any difference in the cough.   There is no postnasal drainage.  No known asthmatic.  Last chest x-ray 2011 and this reported as clear.  We did discuss COVID vaccination in the setting of recent September 2022 BA/5 COVID-infection.  He recovered well from it.  He is going to wait few months before making a decision on taking mRNA booster.  Dr Robert Mckinney Reflux Symptom Index (> 13-15 suggestive of LPR cough) 0 -> 5  =  none ->severe problem 12/23/2020   Hoarseness of problem with voice 0  Clearing  Of Throat 2  Excess throat mucus or feeling of post nasal drip 0  Difficulty swallowing food, liquid or tablets 0  Cough after eating or lying down 2  Breathing difficulties or choking episodes 2  Troublesome or annoying cough 5  Sensation of something sticking in throat or lump in throat 0  Heartburn, chest pain, indigestion, or stomach acid coming up 2  TOTAL 13     CT Chest data  No results found.  Narrative  Clinical Data: Preoperative respiratory exam.  Quadriceps tendon  tear.     CHEST - 2 VIEW     Comparison: None.     Findings: The heart size and vascularity are normal and the lungs  are  clear.  No osseous abnormality.     IMPRESSION:  Normal chest.   Provider: Kathreen Mckinney   Results for Robert Mckinney (MRN [id number]) as of 12/23/2020 11:45  Ref. Range 12/13/2009 13:48 04/21/2019 09:56 09/20/2020 10:35  Eos Latest Ref Range: Not Estab. %  3 4      has a past medical history of Adjustment insomnia, Benign essential hypertension, Mixed hyperlipidemia, Obstructive sleep apnea (adult) (pediatric), and Other testicular dysfunction.   reports that he has never smoked. He has never used smokeless tobacco.  Past Surgical History:  Procedure Laterality Date   APPENDECTOMY     KNEE SURGERY Right    2012   TONSILLECTOMY      No Known Allergies  Immunization History  Administered Date(s) Administered   Fluad Quad(high Dose 65+) 12/12/2018, 11/24/2020   Influenza Inj Mdck Quad Pf 11/13/2019    Moderna Sars-Covid-2 Vaccination 06/23/2020   PFIZER(Purple Top)SARS-COV-2 Vaccination 04/14/2019, 05/09/2019, 12/04/2019   Tdap 11/27/2019   Zoster Recombinat (Shingrix) 11/27/2019, 01/27/2020    No family history on file.   Current Outpatient Medications:    ALPRAZolam (XANAX) 0.5 MG tablet, Take 1 tablet (0.5 mg total) by mouth at bedtime as needed., Disp: 30 tablet, Rfl: 3   lisinopril (ZESTRIL) 20 MG tablet, Take 1 tablet (20 mg total) by mouth daily., Disp: 90 tablet, Rfl: 2   rosuvastatin (CRESTOR) 20 MG tablet, TAKE 1 TABLET(20 MG) BY MOUTH EVERY DAY, Disp: 90 tablet, Rfl: 2   sildenafil (VIAGRA) 100 MG tablet, Take 0.5-1 tablets (50-100 mg total) by mouth daily as needed for erectile dysfunction., Disp: 10 tablet, Rfl: 6   traZODone (DESYREL) 150 MG tablet, Take 1 tablet (150 mg total) by mouth at bedtime., Disp: 90 tablet, Rfl: 2      Objective:   Vitals:   12/23/20 1104  BP: 130/76  Pulse: (!) 56  Temp: 98.3 F (36.8 C)  TempSrc: Oral  SpO2: 98%  Weight: 237 lb 9.6 oz (107.8 kg)  Height: 5' 9.5" (1.765 m)    Estimated body mass index is 34.58 kg/m as calculated from the following:   Height as of this encounter: 5' 9.5" (1.765 m).   Weight as of this encounter: 237 lb 9.6 oz (107.8 kg).  @WEIGHTCHANGE @  [employer]   12/23/20 1104  Weight: 237 lb 9.6 oz (107.8 kg)     Physical Exam  General Appearance:    Alert, cooperative, no distress, appears stated age - yes , Deconditioned looking - no , OBESE  - no, Sitting on Wheelchair -  no  Head:    Normocephalic, without obvious abnormality, atraumatic  Eyes:    PERRL, conjunctiva/corneas clear,  Ears:    Normal TM's and external ear canals, both ears  Nose:   Nares normal, septum midline, mucosa normal, no drainage    or sinus tenderness. OXYGEN ON  - no . Patient is @ ra   Throat:   Lips, mucosa, and tongue normal; teeth and gums normal. Cyanosis on lips - no.  Mallampati class IV  Neck:   Supple,  symmetrical, trachea midline, no adenopathy;    thyroid:  no enlargement/tenderness/nodules; no carotid   bruit or JVD  Back:     Symmetric, no curvature, ROM normal, no CVA tenderness  Lungs:     Distress - no , Wheeze no, Barrell Chest - no, Purse lip breathing - no, Crackles - no.    Chest Wall:    No tenderness or deformity.  Heart:    Regular rate and rhythm, S1 and S2 normal, no rub   or gallop, Murmur - no  Breast Exam:    NOT DONE  Abdomen:     Soft, non-tender, bowel sounds active all four quadrants,    no masses, no organomegaly. Visceral obesity - yes  Genitalia:   NOT DONE  Rectal:   NOT DONE  Extremities:   Extremities - normal, Has Cane - no, Clubbing - no, Edema - no  Pulses:   2+ and symmetric all extremities  Skin:   Stigmata of Connective Tissue Disease - no  Lymph nodes:   Cervical, supraclavicular, and axillary nodes normal  Psychiatric:  Neurologic:   Pleasant - yes, Anxious - no, Flat affect - no  CAm-ICU - neg, Alert and Oriented x 3 - yes, Moves all 4s - yes, Speech - normal, Cognition - intact        Assessment:       ICD-10-CM   1. Chronic cough  R05.3 CBC with Differential/Platelet    IgE    Resp Allergy Profile Regn2DC [facility]    Pulmonary function test    Nitric oxide    CT Chest High Resolution    Resp Allergy Profile Regn2DC DE [facility]    IgE    CBC with Differential/Platelet    2. Irritable larynx syndrome  J38.7          Plan:     Patient Instructions     ICD-10-CM   1. Chronic cough  R05.3     2. Irritable larynx syndrome  J38.7       Thanks for visiting with [place] You have chronic refractory cough which is otherwise call cough neuropathy or irritable larynx syndrome There might be other drivers for this  Plan -Do RSI cough score today - Do CBC with differential, blood IgE and RAST allergy profile - Talk to primary care physician and stop lisinopril but take another medication such  calcium channel blocker -Do full  pulmonary function test - Do FeNO test 12/23/2020 or at followup  - Do high-resolution CT chest supine and prone -Take 2-3 days of and do not talk a whisper during this time -At all times when you have an urge to cough try to drink cold water or suck on sugarless lozenge  Follow-up - Return in the next 2-7 weeks to see Dr. Chase Caller for follow-up to discuss results and decide the neck steps  -Consideration for gabapentin and follow-up depending on results    SIGNATURE    Dr. Brand Mckinney, M.D., F.C.C.P,  Pulmonary and Critical Care Medicine Staff Physician, [place] Director - Interstitial Lung Disease  Program  [facility] [place] [place], [place], [address]  Pager: [phone], If no answer or between  15:00h - 7:00h: call [phone] Telephone: [phone]  12:44 PM 12/23/2020

## 2020-12-23 NOTE — Patient Instructions (Signed)
ICD-10-CM   1. Chronic cough  R05.3     2. Irritable larynx syndrome  J38.7       Thanks for visiting with [place] You have chronic refractory cough which is otherwise call cough neuropathy or irritable larynx syndrome There might be other drivers for this  Plan -Do RSI cough score today - Do CBC with differential, blood IgE and RAST allergy profile - Talk to primary care physician and stop lisinopril but take another medication such  calcium channel blocker -Do full pulmonary function test - Do FeNO test 12/23/2020 or at followup  - Do high-resolution CT chest supine and prone -Take 2-3 days of and do not talk a whisper during this time -At all times when you have an urge to cough try to drink cold water or suck on sugarless lozenge  Follow-up - Return in the next 2-7 weeks to see Dr. Chase Caller for follow-up to discuss results and decide the neck steps  -Consideration for gabapentin and follow-up depending on results

## 2020-12-24 ENCOUNTER — Telehealth: Payer: Self-pay

## 2020-12-24 ENCOUNTER — Other Ambulatory Visit: Payer: Self-pay

## 2020-12-24 LAB — RESPIRATORY ALLERGY PROFILE REGION II ~~LOC~~
Allergen, A. alternata, m6: 0.1 kU/L — ABNORMAL HIGH
Allergen, Cedar tree, t12: 0.95 kU/L — ABNORMAL HIGH
Allergen, Comm Silver Birch, t9: 0.71 kU/L — ABNORMAL HIGH
Allergen, Cottonwood, t14: 0.98 kU/L — ABNORMAL HIGH
Allergen, D pternoyssinus,d7: 0.15 kU/L — ABNORMAL HIGH
Allergen, Mouse Urine Protein, e78: <0.1 kU/L
Allergen, Mulberry, t76: 0.81 kU/L — ABNORMAL HIGH
Allergen, Oak,t7: 1.55 kU/L — ABNORMAL HIGH
Allergen, P. notatum, m1: <0.1 kU/L
Aspergillus fumigatus, m3: <0.1 kU/L
Bermuda Grass: 1.4 kU/L — ABNORMAL HIGH
Box Elder IgE: 1.59 kU/L — ABNORMAL HIGH
CLADOSPORIUM HERBARUM (M2) IGE: <0.1 kU/L
COMMON RAGWEED (SHORT) (W1) IGE: 1.65 kU/L — ABNORMAL HIGH
Cat Dander: <0.1 kU/L
Class: 0
Class: 0
Class: 0
Class: 0
Class: 0
Class: 0
Class: 2
Class: 2
Class: 2
Class: 2
Class: 2
Class: 2
Class: 2
Class: 2
Class: 2
Class: 2
Class: 2
Class: 2
Class: 2
Class: 2
Class: 2
Cockroach: 0.76 kU/L — ABNORMAL HIGH
D. farinae: 0.15 kU/L — ABNORMAL HIGH
Dog Dander: <0.1 kU/L
Elm IgE: 1.61 kU/L — ABNORMAL HIGH
IgE (Immunoglobulin E), Serum: 62 kU/L (ref <=114)
Johnson Grass: 1.5 kU/L — ABNORMAL HIGH
Pecan/Hickory Tree IgE: 1.17 kU/L — ABNORMAL HIGH
Rough Pigweed  IgE: 1.26 kU/L — ABNORMAL HIGH
Sheep Sorrel IgE: 1.68 kU/L — ABNORMAL HIGH
Timothy Grass: 1.69 kU/L — ABNORMAL HIGH

## 2020-12-24 LAB — INTERPRETATION:

## 2020-12-24 MED ORDER — LOSARTAN POTASSIUM 50 MG PO TABS
50.0000 mg | ORAL_TABLET | Freq: Every day | ORAL | 0 refills | Status: DC
Start: 2020-12-24 — End: 2021-03-03

## 2020-12-24 NOTE — Telephone Encounter (Signed)
Patient called today by recommendation of pulmonology.  They recommend changing his BM medication to something else to see if that will help his chronic dry cough.  He currently takes Lisinopril 20 mg QD.  He denies taking anything else in the past.  Please send to [facility].  No known allergies.

## 2021-01-19 ENCOUNTER — Ambulatory Visit (INDEPENDENT_AMBULATORY_CARE_PROVIDER_SITE_OTHER)
Admission: RE | Admit: 2021-01-19 | Discharge: 2021-01-19 | Disposition: A | Discharge status: Home / Self Care | Payer: 59 | Source: Ambulatory Visit | Attending: Internal Medicine | Admitting: Internal Medicine

## 2021-01-19 ENCOUNTER — Other Ambulatory Visit: Payer: Self-pay

## 2021-01-19 DIAGNOSIS — R053 Chronic cough: Secondary | ICD-10-CM

## 2021-02-10 ENCOUNTER — Other Ambulatory Visit (INDEPENDENT_AMBULATORY_CARE_PROVIDER_SITE_OTHER): Payer: 59

## 2021-02-10 ENCOUNTER — Encounter: Payer: Self-pay | Admitting: Internal Medicine

## 2021-02-10 ENCOUNTER — Ambulatory Visit: Payer: 59 | Admitting: Internal Medicine

## 2021-02-10 ENCOUNTER — Other Ambulatory Visit: Payer: Self-pay

## 2021-02-10 ENCOUNTER — Ambulatory Visit (INDEPENDENT_AMBULATORY_CARE_PROVIDER_SITE_OTHER): Payer: 59 | Admitting: Internal Medicine

## 2021-02-10 VITALS — BP 138/82 | HR 67 | Ht 70.0 in | Wt 241.2 lb

## 2021-02-10 DIAGNOSIS — Z9109 Other allergy status, other than to drugs and biological substances: Secondary | ICD-10-CM | POA: Diagnosis not present

## 2021-02-10 DIAGNOSIS — R053 Chronic cough: Secondary | ICD-10-CM

## 2021-02-10 DIAGNOSIS — R918 Other nonspecific abnormal finding of lung field: Secondary | ICD-10-CM

## 2021-02-10 DIAGNOSIS — R058 Other specified cough: Secondary | ICD-10-CM | POA: Diagnosis not present

## 2021-02-10 DIAGNOSIS — T464X5A Adverse effect of angiotensin-converting-enzyme inhibitors, initial encounter: Secondary | ICD-10-CM

## 2021-02-10 DIAGNOSIS — I2510 Atherosclerotic heart disease of native coronary artery without angina pectoris: Secondary | ICD-10-CM

## 2021-02-10 DIAGNOSIS — Z77120 Contact with and (suspected) exposure to mold (toxic): Secondary | ICD-10-CM

## 2021-02-10 LAB — PULMONARY FUNCTION TEST
DL/VA % pred: 127 %
DL/VA: 5.26 ml/min/mmHg/L
DLCO cor % pred: 114 %
DLCO cor: 30.53 ml/min/mmHg
DLCO unc % pred: 122 %
DLCO unc: 32.7 ml/min/mmHg
FEF 25-75 Post: 2.61 L/sec
FEF 25-75 Pre: 2.69 L/sec
FEF2575-%Change-Post: -3 %
FEF2575-%Pred-Post: 95 %
FEF2575-%Pred-Pre: 98 %
FEV1-%Change-Post: 4 %
FEV1-%Pred-Post: 87 %
FEV1-%Pred-Pre: 83 %
FEV1-Post: 3.01 L
FEV1-Pre: 2.87 L
FEV1FVC-%Change-Post: 3 %
FEV1FVC-%Pred-Pre: 108 %
FEV6-%Change-Post: 1 %
FEV6-%Pred-Post: 82 %
FEV6-%Pred-Pre: 81 %
FEV6-Post: 3.6 L
FEV6-Pre: 3.55 L
FEV6FVC-%Pred-Post: 105 %
FEV6FVC-%Pred-Pre: 105 %
FVC-%Change-Post: 1 %
FVC-%Pred-Post: 78 %
FVC-%Pred-Pre: 77 %
FVC-Post: 3.6 L
FVC-Pre: 3.55 L
Post FEV1/FVC ratio: 84 %
Post FEV6/FVC ratio: 100 %
Pre FEV1/FVC ratio: 81 %
Pre FEV6/FVC Ratio: 100 %
RV % pred: 88 %
RV: 2.08 L
TLC % pred: 101 %
TLC: 7.11 L

## 2021-02-10 LAB — SEDIMENTATION RATE: Sed Rate: 5 mm/hr (ref 0–20)

## 2021-02-10 NOTE — Progress Notes (Signed)
OV 12/23/2020  Subjective:  Patient ID: Robert Mckinney, male , DOB: [birth year] , age 65 y.o. , MRN: [id number] , ADDRESS: [address] [place] [address] PCP Lillard Anes, MD Patient Care Team: Lillard Anes, MD as PCP - General (Family Medicine)  This Provider for this visit: Treatment Team:  Attending Provider: Brand Males, MD    12/23/2020 -   Chief Complaint  Patient presents with   Consult    Pt has had a cough for more than 12 years which he states comes and goes. States his cough is worse when he is talking as he has problems with dry throat and also has problems with cold weather.     HPI Robert Mckinney 65 y.o. -accompanied by wife.  He is a son-in-law of my former patient Mr. Geralynn Rile who died from IPF.  He is a chronic cough for over 10 years.  He tells me that many years ago he was diagnosed with hypertension and put on lisinopril and that is when cough started but around 2 years ago when his first wife had cancer he had stopped his lisinopril for 2 years and then restarted it approximately 2 years ago.  Stopping the lisinopril did not make any impact on his cough.  The cough is continued.  He did have COVID in September 2022 b.  He says that periodically when the cough just gets worse he will take amoxicillin and it temporarily helps.  He also states the cough is the almost every day on and off.  RSI cough score is 13.  The COVID did increase the cough.\And that is why he is here.  The cough is made worse by talking a lot [is a salesman for cleaning products].  Also cold air and salty food makes the cough worse.  Cough is worse in the day overnight but sometimes there at night.  There is no wheezing.  He feels his throat is dry and also parched.  He feels sometimes food gets stuck in his throat.  There is no wheezing.  He used to have heartburn but he started taking Prilosec and this is resolved but did not make any difference in the cough.   There is no postnasal drainage.  No known asthmatic.  Last chest x-ray 2011 and this reported as clear.  We did discuss COVID vaccination in the setting of recent September 2022 BA/5 COVID-infection.  He recovered well from it.  He is going to wait few months before making a decision on taking mRNA booster.   CT Chest data  No results found.  Narrative  Clinical Data: Preoperative respiratory exam.  Quadriceps tendon  tear.     CHEST - 2 VIEW     Comparison: None.     Findings: The heart size and vascularity are normal and the lungs  are clear.  No osseous abnormality.     IMPRESSION:  Normal chest.   Provider: Kathreen Cosier   Results for SHERWIN, HOLLINGSHED (MRN [id number]) as of 12/23/2020 11:45  Ref. Range 12/13/2009 13:48 04/21/2019 09:56 09/20/2020 10:35  Eos Latest Ref Range: Not Estab. %  3 4     OV 02/11/2021  Subjective:  Patient ID: Robert Mckinney, male , DOB: 08-29-1955 , age 65 y.o. , MRN: [id number] , ADDRESS: [address] [place] [address] PCP Lillard Anes, MD Patient Care Team: Lillard Anes, MD as PCP - General (Family Medicine)  This Provider for this visit: Treatment Team:  Attending Provider: Brand Males, MD    02/11/2021 -   Chief Complaint  Patient presents with   Follow-up    Pft done today.    Follow-up chronic cough. HPI Robert Mckinney 65 y.o. -presents with his significant other Domenick Bookbinder.  Since his last visit he stopped ACE inhibitor and his cough is better according to his history but RSI cough score shows the cough is still persistent.  His blood allergy test is significantly positive for significant environmental allergens.  In addition is also positive for Aspergillus alternator.  But his blood IgE itself is negative.  His high-resolution CT chest that I personally visualized and also showed him has micronodular appearance raising the concern for hypersensitivity pneumonitis.  Asked him some exposure questions: He  moved to his house that was built in the 1950s in the late 1980s and lived until 2 years ago.  Since then he has been living with his significant other.  In his previous home according to the significant the there is always some mold or musty smell.  Patient himself has not seen any mold.  He goes that periodically to spend a couple of hours to do some work.  O he was have a discharge from the house and that house.  Currently in his partners house he uses a blanket that has feather pillows.  His pulmonary function test does show restriction [he is obese] with increased DLCO.   In addition he has coronary artery calcification.   Dr Lorenza Cambridge Reflux Symptom Index (> 13-15 suggestive of LPR cough) 0 -> 5  =  none ->severe problem 12/23/2020  02/11/2021 Bettter after stoppin ace inhibitor  Hoarseness of problem with voice 0 0  Clearing  Of Throat 2 0  Excess throat mucus or feeling of post nasal drip 0 2  Difficulty swallowing food, liquid or tablets 0 0  Cough after eating or lying down 2 4  Breathing difficulties or choking episodes 2 3  Troublesome or annoying cough 5 3  Sensation of something sticking in throat or lump in throat 0 0  Heartburn, chest pain, indigestion, or stomach acid coming up 2 4  TOTAL 13 16    No results found for: NITRICOXIDE   Latest Reference Range & Units 12/23/20 12:16  Allergen, A. alternata, m6 kU/L 0.10 (H)  Allergen, P. notatum, m1 kU/L <0.10  CLADOSPORIUM HERBARUM (M2) IGE kU/L <0.10  (H): Data is abnormally high   Latest Reference Range & Units 12/23/20 12:16  Sheep Sorrel IgE kU/L 1.68 (H)  Pecan/Hickory Tree IgE kU/L 1.17 (H)  IgE (Immunoglobulin E), Serum <OR=114 kU/L 62  Allergen, D pternoyssinus,d7 kU/L 0.15 (H)  Cat Dander kU/L <0.10  Dog Dander kU/L <0.10  [place] Grass kU/L 1.40 (H)  Johnson Grass kU/L 1.50 (H)  Timothy Grass kU/L 1.69 (H)  Cockroach kU/L 0.76 (H)  Aspergillus fumigatus, m3 kU/L <0.10  Allergen, Comm Silver Wendee Copp, t9  kU/L 0.71 (H)  Allergen, Cottonwood, t14 kU/L 0.98 (H)  Elm IgE kU/L 1.61 (H)  Allergen, Mulberry, t76 kU/L 0.81 (H)  Allergen, Oak,t7 kU/L 1.55 (H)  COMMON RAGWEED (SHORT) (W1) IGE kU/L 1.65 (H)  Allergen, Mouse Urine Protein, e78 kU/L <0.10  D. farinae kU/L 0.15 (H)  Allergen, Cedar tree, t12 kU/L 0.95 (H)  Box Elder IgE kU/L 1.59 (H)  Rough Pigweed  IgE kU/L 1.26 (H)  (H): Data is abnormally high CT Chest data  No results found.  Narrative & Impression  CLINICAL DATA:  Persistent cough for 12 years, nonsmoker, recent COVID exacerbated cough   EXAM: CT CHEST WITHOUT CONTRAST   TECHNIQUE: Multidetector CT imaging of the chest was performed following the standard protocol without intravenous contrast. High resolution imaging of the lungs, as well as inspiratory and expiratory imaging, was performed.   COMPARISON:  None.   FINDINGS: Cardiovascular: No significant vascular findings. Normal heart size. Three-vessel coronary artery calcifications. No pericardial effusion.   Mediastinum/Nodes: No enlarged mediastinal, hilar, or axillary lymph nodes. Thyroid gland, trachea, and esophagus demonstrate no significant findings.   Lungs/Pleura: There are innumerable tiny centrilobular pulmonary nodules scattered throughout the lungs. Lobular air trapping on expiratory phase imaging. No pleural effusion or pneumothorax.   Upper Abdomen: No acute abnormality.   Musculoskeletal: No chest wall mass or suspicious bone lesions identified.   IMPRESSION: 1. There are innumerable tiny centrilobular pulmonary nodules scattered throughout the lungs. In conjunction with air trapping, findings are suggestive of hypersensitivity pneumonitis. No evidence of fibrosis at this time. 2. Coronary artery disease.     Electronically Signed   By: Delanna Ahmadi M.D.   On: 01/20/2021 09:49      PFT  PFT Results Latest Ref Rng & Units 02/10/2021  FVC-Pre L 3.55  FVC-Predicted Pre % 77   FVC-Post L 3.60  FVC-Predicted Post % 78  Pre FEV1/FVC % % 81  Post FEV1/FCV % % 84  FEV1-Pre L 2.87  FEV1-Predicted Pre % 83  FEV1-Post L 3.01  DLCO uncorrected ml/min/mmHg 32.70  DLCO UNC% % 122  DLCO corrected ml/min/mmHg 30.53  DLCO COR %Predicted % 114  DLVA Predicted % 127  TLC L 7.11  TLC % Predicted % 101  RV % Predicted % 88       has a past medical history of Adjustment insomnia, Benign essential hypertension, Mixed hyperlipidemia, Obstructive sleep apnea (adult) (pediatric), and Other testicular dysfunction.   reports that he has never smoked. He has never used smokeless tobacco.  Past Surgical History:  Procedure Laterality Date   APPENDECTOMY     KNEE SURGERY Right    2012   TONSILLECTOMY      No Known Allergies  Immunization History  Administered Date(s) Administered   Fluad Quad(high Dose 65+) 12/12/2018, 11/24/2020   Influenza Inj Mdck Quad Pf 11/13/2019   Moderna Sars-Covid-2 Vaccination 06/23/2020   PFIZER(Purple Top)SARS-COV-2 Vaccination 04/14/2019, 05/09/2019, 12/04/2019   Tdap 11/27/2019   Zoster Recombinat (Shingrix) 11/27/2019, 01/27/2020    No family history on file.   Current Outpatient Medications:    losartan (COZAAR) 50 MG tablet, Take 1 tablet (50 mg total) by mouth daily., Disp: 90 tablet, Rfl: 0   rosuvastatin (CRESTOR) 20 MG tablet, TAKE 1 TABLET(20 MG) BY MOUTH EVERY DAY, Disp: 90 tablet, Rfl: 2   sildenafil (VIAGRA) 100 MG tablet, Take 0.5-1 tablets (50-100 mg total) by mouth daily as needed for erectile dysfunction., Disp: 10 tablet, Rfl: 6   traZODone (DESYREL) 150 MG tablet, Take 1 tablet (150 mg total) by mouth at bedtime., Disp: 90 tablet, Rfl: 2   ALPRAZolam (XANAX) 0.5 MG tablet, Take 1 tablet (0.5 mg total) by mouth at bedtime as needed. (Patient not taking: Reported on 02/10/2021), Disp: 30 tablet, Rfl: 3      Objective:   Vitals:   02/10/21 1409  BP: 138/82  Pulse: 67  SpO2: 97%  Weight: 241 lb 3.2 oz (109.4  kg)  Height: _0  (1.778 m)    Estimated body mass  index is 34.61 kg/m as calculated from the following:   Height as of this encounter: _0  (1.778 m).   Weight as of this encounter: 241 lb 3.2 oz (109.4 kg).  _1 @  Filed Weights   02/10/21 1409  Weight: 241 lb 3.2 oz (109.4 kg)     Physical Exam    General: No distress. Looks well Neuro: Alert and Oriented x 3. GCS 15. Speech normal Psych: Pleasant Resp:  Barrel Chest - no.  Wheeze - no, Crackles - no, No overt respiratory distress CVS: Normal heart sounds. Murmurs - no Ext: Stigmata of Connective Tissue Disease - no HEENT: Normal upper airway. PEERL +. No post nasal drip        Assessment:       ICD-10-CM   1. Chronic cough  R05.3 QuantiFERON-TB Gold Plus    ANA    Angiotensin converting enzyme    Rheumatoid factor    Anti-DNA antibody, double-stranded    Cyclic citrul peptide antibody, IgG    Hypersensitivity Pneumonitis    Sed Rate (ESR)    CANCELED: POCT EXHALED NITRIC OXIDE    2. ACE-inhibitor cough  R05.8 QuantiFERON-TB Gold Plus   T46.4X5A ANA    Angiotensin converting enzyme    Rheumatoid factor    Anti-DNA antibody, double-stranded    Cyclic citrul peptide antibody, IgG    Hypersensitivity Pneumonitis    Sed Rate (ESR)    3. Mold exposure  Z77.120 QuantiFERON-TB Gold Plus    ANA    Angiotensin converting enzyme    Rheumatoid factor    Anti-DNA antibody, double-stranded    Cyclic citrul peptide antibody, IgG    Hypersensitivity Pneumonitis    Sed Rate (ESR)    4. Environmental allergies  Z91.09 QuantiFERON-TB Gold Plus    ANA    Angiotensin converting enzyme    Rheumatoid factor    Anti-DNA antibody, double-stranded    Cyclic citrul peptide antibody, IgG    Hypersensitivity Pneumonitis    Sed Rate (ESR)    5. Abnormal CT scan of lung  R91.8 QuantiFERON-TB Gold Plus    ANA    Angiotensin converting enzyme    Rheumatoid factor    Anti-DNA antibody, double-stranded     Cyclic citrul peptide antibody, IgG    Hypersensitivity Pneumonitis    Sed Rate (ESR)    6. Coronary artery calcification seen on CAT scan  I25.10 QuantiFERON-TB Gold Plus    ANA    Angiotensin converting enzyme    Rheumatoid factor    Anti-DNA antibody, double-stranded    Cyclic citrul peptide antibody, IgG    Hypersensitivity Pneumonitis    Sed Rate (ESR)     He really wants to get a biopsy done soon.  With a coronary artery calcification I referred him to cardiologist.  In addition before embarking the biopsy I told him I wanted to discuss the multidisciplinary case conference.  Also will give him ILD questionnaire.  And get some autoimmune serology.    Plan:     Patient Instructions  Chronic cough - ACE-inhibitor cough Mold exposure Environmental allergies Abnormal CT scan of lung  -Glad your cough is better after stopping ACE inhibitor but there is still residual cough - cocern is for a typle of condition called HP -otherwise call hypersensitivity pneumonitis  -This could be related to mold/mildew exposure at the primary home that he lived between 1987 and 2020 and current exposure to feather blanket -Differential diagnoses another condition called [place]  -  Do blood work for QuantiFERON gold, ANA, ACE, ESR, rheumatoid factor, double-stranded DNA, CCP and hypersensitivity pneumonitis panel -We will discuss in a case conference with multiple other doctors in January 2023 -Most likely the recommendation will be for bronchoscopy with lavage  -I would rather wait till the case conference is completed before we proceed with bronchoscopy -Meanwhile we can try empiric inhaler Breo 1 puff once daily [low-dose] -Take interstitial lung disease questionnaire at home and bring it back with you Without fail  Coronary artery calcification seen on CAT scan  Plan  - Refer cardiology  Follow-up -March 2023 with Dr. Chase Caller; 30-minute visit -     SIGNATURE    Dr.  Brand Males, M.D., F.C.C.P,  Pulmonary and Critical Care Medicine Staff Physician, [facility] Director - Interstitial Lung Disease  Program  P[facility] [place], [place], [address]  Pager: [phone], If no answer or between  15:00h - 7:00h: call [phone] Telephone: [phone]  4:35 PM 02/11/2021

## 2021-02-10 NOTE — Patient Instructions (Addendum)
Chronic cough - ACE-inhibitor cough Mold exposure Environmental allergies Abnormal CT scan of lung  -Glad your cough is better after stopping ACE inhibitor but there is still residual cough - cocern is for a typle of condition called HP -otherwise call hypersensitivity pneumonitis  -This could be related to mold/mildew exposure at the primary home that he lived between 1987 and 2020 and current exposure to feather blanket -Differential diagnoses another condition called [place]  - Do blood work for QuantiFERON gold, ANA, ACE, ESR, rheumatoid factor, double-stranded DNA, CCP and hypersensitivity pneumonitis panel -We will discuss in a case conference with multiple other doctors in January 2023 -Most likely the recommendation will be for bronchoscopy with lavage  -I would rather wait till the case conference is completed before we proceed with bronchoscopy -Meanwhile we can try empiric inhaler Breo 1 puff once daily [low-dose] -Take interstitial lung disease questionnaire at home and bring it back with you Without fail  Coronary artery calcification seen on CAT scan  Plan  - Refer cardiology  Follow-up -March 2023 with Dr. Chase Caller; 30-minute visit -

## 2021-02-11 NOTE — Progress Notes (Signed)
PFT

## 2021-02-14 LAB — QUANTIFERON-TB GOLD PLUS
Mitogen-NIL: >10 IU/mL
NIL: 0.1 IU/mL
QuantiFERON-TB Gold Plus: NEGATIVE
TB1-NIL: 0.02 IU/mL
TB2-NIL: 0.04 IU/mL

## 2021-02-14 LAB — HYPERSENSITIVITY PNEUMONITIS
A. Pullulans Abs: NEGATIVE
A.Fumigatus #1 Abs: NEGATIVE
Micropolyspora faeni, IgG: NEGATIVE
Pigeon Serum Abs: NEGATIVE
Thermoact. Saccharii: NEGATIVE
Thermoactinomyces vulgaris, IgG: NEGATIVE

## 2021-02-14 LAB — ANGIOTENSIN CONVERTING ENZYME: Angiotensin-Converting Enzyme: 25 U/L (ref 9–67)

## 2021-02-14 LAB — CYCLIC CITRUL PEPTIDE ANTIBODY, IGG: Cyclic Citrullin Peptide Ab: <16 UNITS

## 2021-02-14 LAB — ANTI-DNA ANTIBODY, DOUBLE-STRANDED: ds DNA Ab: <1 IU/mL

## 2021-02-14 LAB — ANA: Anti Nuclear Antibody (ANA): NEGATIVE

## 2021-02-14 LAB — RHEUMATOID FACTOR: Rheumatoid fact SerPl-aCnc: <14 IU/mL (ref <14)

## 2021-02-18 ENCOUNTER — Telehealth: Payer: Self-pay | Admitting: Internal Medicine

## 2021-02-18 ENCOUNTER — Other Ambulatory Visit: Payer: Self-pay | Admitting: *Deleted

## 2021-02-18 DIAGNOSIS — R053 Chronic cough: Secondary | ICD-10-CM

## 2021-02-18 MED ORDER — FLUTICASONE FUROATE-VILANTEROL 100-25 MCG/ACT IN AEPB: 1.0000 | INHALATION_SPRAY | Freq: Every day | RESPIRATORY_TRACT | 5 refills | Status: DC

## 2021-02-18 NOTE — Telephone Encounter (Signed)
Called and spoke with patient regarding the [facility] and the Cardiologist referral.  Advised that I would send the Breo to his pharmacy, patient states that he is no longer in [place], he has moved in with his girlfriend in [place] and uses [facility] [address].  Script sent.  Advised patient that a referral has been sent to cardiology and the office will call him directly.  He verbalized understanding.  Nothing further needed.

## 2021-02-18 NOTE — Telephone Encounter (Signed)
Lm for patient.  

## 2021-03-03 ENCOUNTER — Other Ambulatory Visit: Payer: Self-pay

## 2021-03-03 MED ORDER — LOSARTAN POTASSIUM 50 MG PO TABS: 50.0000 mg | ORAL_TABLET | Freq: Every day | ORAL | 0 refills | Status: DC

## 2021-03-16 NOTE — Progress Notes (Signed)
Cardiology Office Note   Date:  03/17/2021   ID:  Robert Mckinney, DOB 1956-02-22, MRN [id number]  PCP:  Lillard Anes, MD  Cardiologist:   None Referring:  Brand Males, MD   Chief Complaint  Patient presents with   Elevated coronary calcium      History of Present Illness: Robert Mckinney is a 66 y.o. male who presents for evaluation of CAD.  He was noted to have coronary calcium on a recent CT.  He was having an evaluation for a chronic cough.    He has never had coronary disease and stroke.  He denies any cardiovascular symptoms. The patient denies any new symptoms such as chest discomfort, neck or arm discomfort. There has been no new shortness of breath, PND or orthopnea. There have been no reported palpitations, presyncope or syncope.   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  He does have significant cardiovascular risk factors.  Past Medical History:  Diagnosis Date   Adjustment insomnia    Benign essential hypertension    Mixed hyperlipidemia    Obstructive sleep apnea (adult) (pediatric)    Other testicular dysfunction     Past Surgical History:  Procedure Laterality Date   APPENDECTOMY     KNEE SURGERY Bilateral    TONSILLECTOMY       Current Outpatient Medications  Medication Sig Dispense Refill   fluticasone furoate-vilanterol (BREO ELLIPTA) 100-25 MCG/ACT AEPB Inhale 1 puff into the lungs daily. 60 each 5   losartan (COZAAR) 50 MG tablet Take 1 tablet (50 mg total) by mouth daily. 90 tablet 0   rosuvastatin (CRESTOR) 20 MG tablet TAKE 1 TABLET(20 MG) BY MOUTH EVERY DAY 90 tablet 2   sildenafil (VIAGRA) 100 MG tablet Take 0.5-1 tablets (50-100 mg total) by mouth daily as needed for erectile dysfunction. 10 tablet 6   traZODone (DESYREL) 150 MG tablet Take 1 tablet (150 mg total) by mouth at bedtime. 90 tablet 2   No current facility-administered medications for this visit.     Allergies:   Patient has no known allergies.    Social History:  The patient  reports that he has never smoked. He has never used smokeless tobacco. He reports current alcohol use of about 1.0 standard drink per week. He reports that he does not use drugs.   Family History:   No history of CAD.     ROS:  Please see the history of present illness.   Otherwise, review of systems are positive for none.   All other systems are reviewed and negative.    PHYSICAL EXAM: VS:  BP (!) 144/78    Pulse 63    Ht 5' 9.5" (1.765 m)    Wt 242 lb (109.8 kg)    SpO2 98%    BMI 35.22 kg/m  , BMI Body mass index is 35.22 kg/m. GENERAL:  Well appearing HEENT:  Pupils equal round and reactive, fundi not visualized, oral mucosa unremarkable NECK:  No jugular venous distention, waveform within normal limits, carotid upstroke brisk and symmetric, no bruits, no thyromegaly LYMPHATICS:  No cervical, inguinal adenopathy LUNGS:  Clear to auscultation bilaterally BACK:  No CVA tenderness CHEST:  Unremarkable HEART:  PMI not displaced or sustained,S1 and S2 within normal limits, no S3, no S4, no clicks, no rubs, no murmurs ABD:  Flat, positive bowel sounds normal in frequency in pitch, no bruits, no  rebound, no guarding, no midline pulsatile mass, no hepatomegaly, no splenomegaly EXT:  2 plus pulses throughout, no edema, no cyanosis no clubbing SKIN:  No rashes no nodules NEURO:  Cranial nerves II through XII grossly intact, motor grossly intact throughout PSYCH:  Cognitively intact, oriented to person place and time    EKG:  EKG is ordered today. The ekg ordered today demonstrates sinus rhythm, rate 63, axis within normal   Recent Labs: 09/20/2020: ALT 19; BUN 11; Creatinine, Ser 1.39; Potassium 4.7; Sodium 142 12/23/2020: Hemoglobin 17.4; Platelets 176.0    Lipid Panel    Component Value Date/Time   CHOL 127 09/20/2020 1035   TRIG 133 09/20/2020 1035   HDL 32 (L) 09/20/2020 1035   CHOLHDL 4.0  09/20/2020 1035   LDLCALC 71 09/20/2020 1035   LDLDIRECT 84 04/21/2019 0956      Wt Readings from Last 3 Encounters:  03/17/21 242 lb (109.8 kg)  02/10/21 241 lb 3.2 oz (109.4 kg)  12/23/20 237 lb 9.6 oz (107.8 kg)      Other studies Reviewed: Additional studies/ records that were reviewed today include: Office records and labs. Review of the above records demonstrates:  Please see elsewhere in the note.     ASSESSMENT AND PLAN:  ELEVATED CORONARY CALCIUM:   I will bring the patient back for a POET (Plain Old Exercise Test). This will allow me to screen for obstructive coronary disease, risk stratify and very importantly provide a prescription for exercise.  DYSLIPIDEMIA:  His LDL was very close to target and I suggested a Mediterranean diet and daily physical therapy as listed.  HTN: His blood pressure is normal.  No change in therapy.  OBESTIY: We talked at length about diet and exercise and I gave him goals of therapy.  He was also given some literature.   Current medicines are reviewed at length with the patient today.  The patient does not have concerns regarding medicines.  The following changes have been made:  no change  Labs/ tests ordered today include: None  Orders Placed This Encounter  Procedures   EXERCISE TOLERANCE TEST (ETT)   EKG 12-Lead     Disposition:   FU with me as needed.      Signed, Minus Breeding, MD  03/17/2021 8:34 AM    [facility]

## 2021-03-17 ENCOUNTER — Other Ambulatory Visit: Payer: Self-pay

## 2021-03-17 ENCOUNTER — Ambulatory Visit (INDEPENDENT_AMBULATORY_CARE_PROVIDER_SITE_OTHER): Payer: 59 | Admitting: Cardiology

## 2021-03-17 ENCOUNTER — Encounter: Payer: Self-pay | Admitting: Cardiology

## 2021-03-17 VITALS — BP 144/78 | HR 63 | Ht 69.5 in | Wt 242.0 lb

## 2021-03-17 DIAGNOSIS — I2584 Coronary atherosclerosis due to calcified coronary lesion: Secondary | ICD-10-CM | POA: Diagnosis not present

## 2021-03-17 DIAGNOSIS — E669 Obesity, unspecified: Secondary | ICD-10-CM

## 2021-03-17 DIAGNOSIS — I10 Essential (primary) hypertension: Secondary | ICD-10-CM

## 2021-03-17 DIAGNOSIS — I2510 Atherosclerotic heart disease of native coronary artery without angina pectoris: Secondary | ICD-10-CM

## 2021-03-17 NOTE — Patient Instructions (Signed)
Medication Instructions:  Your physician recommends that you continue on your current medications as directed. Please refer to the Current Medication list given to you today.  *If you need a refill on your cardiac medications before your next appointment, please call your pharmacy*   Testing/Procedures: Dr. Percival Spanish has ordered an exercise tolerance test.   Exercise Stress Test An exercise stress test is a test to check how your heart works during exercise. You will need to walk on a treadmill or ride an exercise bike for this test. An electrocardiogram (ECG) will record your heartbeat when you are at rest and when you are exercising. You may have an ultrasound or nuclear test after the exercise test. The test is done to check for coronary artery disease (CAD). It is also done to: See how well you can exercise. Watch for high blood pressure during exercise. Test how well you can exercise after treatment. Check the blood flow to your arms and legs. If your test result is not normal, more testing may be needed. What happens before the procedure? Follow instructions from your doctor about what you cannot eat or drink. Do not have any drinks or foods that have caffeine in them for 24 hours before the test, or as told by your doctor. This includes coffee, tea (even decaf tea), sodas, chocolate, and cocoa. Ask your doctor about changing or stopping your normal medicines. This is important if you: Take diabetes medicines. Take beta-blocker medicines. Wear a nitroglycerin patch. If you use an inhaler, bring it with you to the test. Do not put lotions, powders, creams, or oils on your chest before the test. Wear comfortable shoes and clothing. Do not use any products that have nicotine or tobacco in them, such as cigarettes and e-cigarettes. Stop using them at least 4 hours before the test. If you need help quitting, ask your doctor. What happens during the procedure?  Patches (electrodes) will  be put on your chest. Wires will be connected to the patches. The wires will send signals to a machine to record your heartbeat. Your heart rate will be watched while you are resting and while you are exercising. Your blood pressure will also be watched during the test. You will walk on a treadmill or use an exercise bike. If you cannot use these, you may be asked to turn a crank with your hands. The activity will get harder and will raise your heart rate. You may be asked to breathe into a tube a few times during the test. This measures the gases that you breathe out. You will be asked how you are feeling throughout the test. You will exercise until your heart reaches a target heart rate. You will stop early if: You feel dizzy. You have chest pain. You are out of breath. Your blood pressure is too high or too low. You have an irregular heartbeat. You have pain or aching in your arms or legs. The procedure may vary among doctors and hospitals. What happens after the procedure? Your blood pressure, heart rate, breathing rate, and blood oxygen level will be watched after the test. You may return to your normal diet and activities as told by your doctor. It is up to you to get the results of your test. Ask your doctor, or the department that is doing the test, when your results will be ready. Summary An exercise stress test is a test to check how your heart works during exercise. This test is done to check for  coronary artery disease. Your heart rate will be watched while you are resting and while you are exercising. Follow instructions from your doctor about what you cannot eat or drink before the test. This information is not intended to replace advice given to you by your health care provider. Make sure you discuss any questions you have with your health care provider. Document Revised: 10/21/2019 Document Reviewed: 10/24/2019 Elsevier Patient Education  2022 [place]: At [facility], you and your health needs are our priority.  As part of our continuing mission to provide you with exceptional heart care, we have created designated Provider Care Teams.  These Care Teams include your primary Cardiologist (physician) and Advanced Practice Providers (APPs -  Physician Assistants and Nurse Practitioners) who all work together to provide you with the care you need, when you need it.  We recommend signing up for the patient portal called "MyChart".  Sign up information is provided on this After Visit Summary.  MyChart is used to connect with patients for Virtual Visits (Telemedicine).  Patients are able to view lab/test results, encounter notes, upcoming appointments, etc.  Non-urgent messages can be sent to your provider as well.   To learn more about what you can do with MyChart, go to [email or web address].    Your next appointment:   AS NEEDED with Dr. Percival Spanish

## 2021-03-22 ENCOUNTER — Telehealth (HOSPITAL_COMMUNITY): Payer: Self-pay | Admitting: *Deleted

## 2021-03-22 NOTE — Telephone Encounter (Signed)
Close encounter 

## 2021-03-24 ENCOUNTER — Ambulatory Visit (HOSPITAL_COMMUNITY)
Admission: RE | Admit: 2021-03-24 | Discharge: 2021-03-24 | Disposition: A | Discharge status: Home / Self Care | Payer: 59 | Source: Ambulatory Visit | Attending: Internal Medicine | Admitting: Internal Medicine

## 2021-03-24 ENCOUNTER — Other Ambulatory Visit: Payer: Self-pay

## 2021-03-24 DIAGNOSIS — I2584 Coronary atherosclerosis due to calcified coronary lesion: Secondary | ICD-10-CM

## 2021-03-24 DIAGNOSIS — I2510 Atherosclerotic heart disease of native coronary artery without angina pectoris: Secondary | ICD-10-CM

## 2021-03-24 LAB — EXERCISE TOLERANCE TEST
Angina Index: 0
Duke Treadmill Score: -1
Estimated workload: 10.5
Exercise duration (min): 9 min
Exercise duration (sec): 15 s
MPHR: 155 {beats}/min
Peak HR: 144 {beats}/min
Percent HR: 92 %
Rest HR: 66 {beats}/min
ST Elevation (mm): 2 mm

## 2021-03-28 ENCOUNTER — Other Ambulatory Visit: Payer: Self-pay | Admitting: *Deleted

## 2021-03-28 ENCOUNTER — Encounter: Payer: Self-pay | Admitting: *Deleted

## 2021-03-28 ENCOUNTER — Telehealth: Payer: Self-pay | Admitting: *Deleted

## 2021-03-28 DIAGNOSIS — R9439 Abnormal result of other cardiovascular function study: Secondary | ICD-10-CM

## 2021-03-28 DIAGNOSIS — I2584 Coronary atherosclerosis due to calcified coronary lesion: Secondary | ICD-10-CM

## 2021-03-28 DIAGNOSIS — I2510 Atherosclerotic heart disease of native coronary artery without angina pectoris: Secondary | ICD-10-CM

## 2021-03-28 MED ORDER — METOPROLOL TARTRATE 100 MG PO TABS: ORAL_TABLET | ORAL | 0 refills | Status: DC

## 2021-03-28 NOTE — Telephone Encounter (Signed)
-----   Message from Minus Breeding, MD sent at 03/28/2021 12:51 PM EST ----- Called with results.  Needs coronary CTA.  Please arranged.  Send results to Lillard Anes, MD

## 2021-03-30 NOTE — Telephone Encounter (Signed)
Patient has been scheduled 2/7

## 2021-04-06 ENCOUNTER — Telehealth (HOSPITAL_COMMUNITY): Payer: Self-pay | Admitting: Emergency Medicine

## 2021-04-06 DIAGNOSIS — I2584 Coronary atherosclerosis due to calcified coronary lesion: Secondary | ICD-10-CM

## 2021-04-06 DIAGNOSIS — I2510 Atherosclerotic heart disease of native coronary artery without angina pectoris: Secondary | ICD-10-CM

## 2021-04-06 NOTE — Telephone Encounter (Signed)
Attempted to call patient regarding upcoming cardiac CT appointment. Left message on voicemail with name and callback number Marchia Bond RN Navigator Cardiac Imaging [facility] [phone] Office [phone] Cell  Reminded to get labs for CCTA appt (BMP)

## 2021-04-11 ENCOUNTER — Other Ambulatory Visit (HOSPITAL_COMMUNITY): Payer: Self-pay | Admitting: Emergency Medicine

## 2021-04-11 ENCOUNTER — Telehealth (HOSPITAL_COMMUNITY): Payer: Self-pay | Admitting: Emergency Medicine

## 2021-04-11 DIAGNOSIS — I2510 Atherosclerotic heart disease of native coronary artery without angina pectoris: Secondary | ICD-10-CM

## 2021-04-11 MED ORDER — METOPROLOL TARTRATE 100 MG PO TABS: ORAL_TABLET | ORAL | 0 refills | Status: DC

## 2021-04-11 NOTE — Telephone Encounter (Signed)
Reaching out to patient to offer assistance regarding upcoming cardiac imaging study; pt verbalizes understanding of appt date/time, parking situation and where to check in, pre-test NPO status and medications ordered, and verified current allergies; name and call back number provided for further questions should they arise Marchia Bond RN Navigator Cardiac Imaging [facility] [phone] office [phone] cell  Represcribed metoprolol tartrate 100mg  to his pharm on file (no longer lives in [place]) Denies iv issues Arrival [address] viagra

## 2021-04-11 NOTE — Telephone Encounter (Signed)
Attempted to call patient regarding upcoming cardiac CT appointment. Left message on voicemail with name and callback number Eldin Bonsell RN Navigator Cardiac Imaging [facility] [phone] Office [phone] Cell  Reminded to get labs 

## 2021-04-12 ENCOUNTER — Other Ambulatory Visit: Payer: Self-pay

## 2021-04-12 ENCOUNTER — Other Ambulatory Visit: Payer: Self-pay | Admitting: Cardiovascular Disease

## 2021-04-12 ENCOUNTER — Ambulatory Visit (HOSPITAL_COMMUNITY)
Admission: RE | Admit: 2021-04-12 | Discharge: 2021-04-12 | Disposition: A | Discharge status: Home / Self Care | Payer: 59 | Source: Ambulatory Visit | Attending: Cardiology | Admitting: Cardiology

## 2021-04-12 ENCOUNTER — Ambulatory Visit (HOSPITAL_COMMUNITY)
Admission: RE | Admit: 2021-04-12 | Discharge: 2021-04-12 | Disposition: A | Discharge status: Home / Self Care | Payer: 59 | Source: Ambulatory Visit | Attending: Cardiovascular Disease | Admitting: Cardiovascular Disease

## 2021-04-12 ENCOUNTER — Encounter (HOSPITAL_COMMUNITY): Payer: Self-pay

## 2021-04-12 DIAGNOSIS — R931 Abnormal findings on diagnostic imaging of heart and coronary circulation: Secondary | ICD-10-CM | POA: Diagnosis not present

## 2021-04-12 DIAGNOSIS — I259 Chronic ischemic heart disease, unspecified: Secondary | ICD-10-CM | POA: Insufficient documentation

## 2021-04-12 DIAGNOSIS — I2510 Atherosclerotic heart disease of native coronary artery without angina pectoris: Secondary | ICD-10-CM | POA: Insufficient documentation

## 2021-04-12 DIAGNOSIS — I2584 Coronary atherosclerosis due to calcified coronary lesion: Secondary | ICD-10-CM | POA: Insufficient documentation

## 2021-04-12 DIAGNOSIS — R9439 Abnormal result of other cardiovascular function study: Secondary | ICD-10-CM | POA: Diagnosis present

## 2021-04-12 LAB — BASIC METABOLIC PANEL
BUN/Creatinine Ratio: 12 (ref 10–24)
BUN: 18 mg/dL (ref 8–27)
CO2: 22 mmol/L (ref 20–29)
Calcium: 11 mg/dL — ABNORMAL HIGH (ref 8.6–10.2)
Chloride: 103 mmol/L (ref 96–106)
Creatinine, Ser: 1.45 mg/dL — ABNORMAL HIGH (ref 0.76–1.27)
Glucose: 83 mg/dL (ref 70–99)
Potassium: 4.1 mmol/L (ref 3.5–5.2)
Sodium: 140 mmol/L (ref 134–144)
eGFR: 53 mL/min/{1.73_m2} — ABNORMAL LOW (ref >59)

## 2021-04-12 LAB — POCT I-STAT CREATININE: Creatinine, Ser: 1.1 mg/dL (ref 0.61–1.24)

## 2021-04-12 MED ORDER — NITROGLYCERIN 0.4 MG SL SUBL
SUBLINGUAL_TABLET | SUBLINGUAL | Status: AC
  Filled 2021-04-12: qty 2

## 2021-04-12 MED ORDER — IOHEXOL 350 MG/ML SOLN
95.0000 mL | Freq: Once | INTRAVENOUS | Status: AC | PRN
  Administered 2021-04-12: 95 mL via INTRAVENOUS

## 2021-04-12 MED ORDER — NITROGLYCERIN 0.4 MG SL SUBL
0.8000 mg | SUBLINGUAL_TABLET | Freq: Once | SUBLINGUAL | Status: AC
Start: 2021-04-12 — End: 2021-04-12
  Administered 2021-04-12: 0.8 mg via SUBLINGUAL

## 2021-04-13 ENCOUNTER — Other Ambulatory Visit: Payer: Self-pay | Admitting: *Deleted

## 2021-04-13 ENCOUNTER — Encounter: Payer: Self-pay | Admitting: *Deleted

## 2021-04-13 DIAGNOSIS — I10 Essential (primary) hypertension: Secondary | ICD-10-CM

## 2021-04-14 ENCOUNTER — Encounter: Payer: Self-pay | Admitting: Cardiology

## 2021-04-14 NOTE — Telephone Encounter (Signed)
Patient returned Dr. Rosezella [place] call for test results.

## 2021-04-14 NOTE — Telephone Encounter (Signed)
Routed to MD/RN 

## 2021-04-14 NOTE — Telephone Encounter (Signed)
This encounter was created in error - please disregard.

## 2021-04-15 ENCOUNTER — Encounter: Payer: Self-pay | Admitting: Cardiology

## 2021-04-15 DIAGNOSIS — E782 Mixed hyperlipidemia: Secondary | ICD-10-CM

## 2021-04-15 MED ORDER — ASPIRIN EC 81 MG PO TBEC: 81.0000 mg | DELAYED_RELEASE_TABLET | Freq: Every day | ORAL | 11 refills | Status: AC

## 2021-04-15 MED ORDER — METOPROLOL SUCCINATE ER 50 MG PO TB24: 50.0000 mg | ORAL_TABLET | Freq: Every day | ORAL | 3 refills | Status: DC

## 2021-04-15 MED ORDER — ROSUVASTATIN CALCIUM 40 MG PO TABS: 40.0000 mg | ORAL_TABLET | Freq: Every day | ORAL | 3 refills | Status: DC

## 2021-04-18 ENCOUNTER — Encounter: Payer: Self-pay | Admitting: *Deleted

## 2021-04-18 ENCOUNTER — Telehealth: Payer: Self-pay | Admitting: *Deleted

## 2021-04-18 MED ORDER — METOPROLOL SUCCINATE ER 50 MG PO TB24: 50.0000 mg | ORAL_TABLET | Freq: Every day | ORAL | 3 refills | Status: DC

## 2021-04-18 NOTE — Telephone Encounter (Signed)
-----   Message from Minus Breeding, MD sent at 04/14/2021 10:28 PM EST ----- I spoke at length with the patient and reviewed his FFR.  The positivity of the FFR is in the distal vessel.  He has no symptoms even at 9 minutes on the treadmill.  This was not an early positive test.  There is data to support conservative management and I did discuss this with the patient and his wife.  I told him to start ASA 81 mg.  He needs to increase his Crestor to 40 mg and start metoprolol 50 mg XL daily.  I would like to see him back in one month to discuss.   Please call him to confirm with him that I have further reviewed the images.  Send results to Lillard Anes, MD

## 2021-04-18 NOTE — Telephone Encounter (Signed)
Spoke with pt, he is concerned about the conversation he and dr hochrein had but he did not have any questions. He reports he is not able tot tolerate the crestor. He reports frequent coughing, he reports he does have a problem with his lungs but the crestor made the coughing much worse. He reports since stopping the medication, his cough has improved. He wants to wait until his follow up appointment to discuss medications with dr hochrein. Follow up scheduled

## 2021-04-21 ENCOUNTER — Other Ambulatory Visit: Payer: Self-pay

## 2021-04-21 ENCOUNTER — Ambulatory Visit (INDEPENDENT_AMBULATORY_CARE_PROVIDER_SITE_OTHER): Payer: 59 | Admitting: Internal Medicine

## 2021-04-21 ENCOUNTER — Encounter: Payer: Self-pay | Admitting: Internal Medicine

## 2021-04-21 VITALS — BP 122/72 | HR 63 | Temp 98.3°F | Ht 69.5 in | Wt 237.0 lb

## 2021-04-21 DIAGNOSIS — R053 Chronic cough: Secondary | ICD-10-CM

## 2021-04-21 NOTE — Progress Notes (Signed)
OV 12/23/2020  Subjective:  Patient ID: Robert Mckinney, male , DOB: 1955/12/06 , age 66 y.o. , MRN: [id number] , ADDRESS: [address] [place] [id number] PCP Lillard Anes, MD Patient Care Team: Lillard Anes, MD as PCP - General (Family Medicine)  This Provider for this visit: Treatment Team:  Attending Provider: Brand Males, MD    12/23/2020 -   Chief Complaint  Patient presents with   Consult    Pt has had a cough for more than 12 years which he states comes and goes. States his cough is worse when he is talking as he has problems with dry throat and also has problems with cold weather.     HPI Robert Mckinney 66 y.o. -accompanied by wife.  He is a son-in-law of my former patient Mr. Robert Mckinney who died from IPF.  He is a chronic cough for over 66 years.  He tells me that many years ago he was diagnosed with hypertension and put on lisinopril and that is when cough started but around 2 years ago when his first wife had cancer he had stopped his lisinopril for 2 years and then restarted it approximately 2 years ago.  Stopping the lisinopril did not make any impact on his cough.  The cough is continued.  He did have COVID in September 2022 b.  He says that periodically when the cough just gets worse he will take amoxicillin and it temporarily helps.  He also states the cough is the almost every day on and off.  RSI cough score is 13.  The COVID did increase the cough.\And that is why he is here.  The cough is made worse by talking a lot [is a salesman for cleaning products].  Also cold air and salty food makes the cough worse.  Cough is worse in the day overnight but sometimes there at night.  There is no wheezing.  He feels his throat is dry and also parched.  He feels sometimes food gets stuck in his throat.  There is no wheezing.  He used to have heartburn but he started taking Prilosec and this is resolved but did not make any difference in the cough.   There is no postnasal drainage.  No known asthmatic.  Last chest x-ray 2011 and this reported as clear.  We did discuss COVID vaccination in the setting of recent September 2022 BA/5 COVID-infection.  He recovered well from it.  He is going to wait few months before making a decision on taking mRNA booster.   CT Chest data  No results found.  Narrative  Clinical Data: Preoperative respiratory exam.  Quadriceps tendon  tear.     CHEST - 2 VIEW     Comparison: None.     Findings: The heart size and vascularity are normal and the lungs  are clear.  No osseous abnormality.     IMPRESSION:  Normal chest.   Provider: Kathreen Cosier   Results for CORINTHIAN, MIZRAHI (MRN [id number]) as of 12/23/2020 11:45  Ref. Range 12/13/2009 13:48 04/21/2019 09:56 09/20/2020 10:35  Eos Latest Ref Range: Not Estab. %  3 4     OV 02/11/2021  Subjective:  Patient ID: Robert Mckinney, male , DOB: Jul 14, 1955 , age 66 y.o. , MRN: [id number] , ADDRESS: [address] [place] [id number] PCP Lillard Anes, MD Patient Care Team: Lillard Anes, MD as PCP - General (Family Medicine)  This Provider for this visit: Treatment Team:  Attending Provider: Brand Males, MD    02/11/2021 -   Chief Complaint  Patient presents with   Follow-up    Pft done today.    Follow-up chronic cough. HPI Robert Mckinney 66 y.o. -presents with his significant other Robert Mckinney.  Since his last visit he stopped ACE inhibitor and his cough is better according to his history but RSI cough score shows the cough is still persistent.  His blood allergy test is significantly positive for significant environmental allergens.  In addition is also positive for Aspergillus alternator.  But his blood IgE itself is negative.  His high-resolution CT chest that I personally visualized and also showed him has micronodular appearance raising the concern for hypersensitivity pneumonitis.  Asked him some exposure questions: He  moved to his house that was built in the 1950s in the late 1980s and lived until 2 years ago.  Since then he has been living with his significant other.  In his previous home according to the significant the there is always some mold or musty smell.  Patient himself has not seen any mold.  He goes that periodically to spend a couple of hours to do some work.  O he was have a discharge from the house and that house.  Currently in his partners house he uses a blanket that has feather pillows.  His pulmonary function test does show restriction [he is obese] with increased DLCO.   In addition he has coronary artery calcification.   No results found for: NITRICOXIDE   Latest Reference Range & Units 12/23/20 12:16  Allergen, A. alternata, m6 kU/L 0.10 (H)  Allergen, P. notatum, m1 kU/L <0.10  CLADOSPORIUM HERBARUM (M2) IGE kU/L <0.10  (H): Data is abnormally high   Latest Reference Range & Units 12/23/20 12:16  Sheep Sorrel IgE kU/L 1.68 (H)  Pecan/Hickory Tree IgE kU/L 1.17 (H)  IgE (Immunoglobulin E), Serum <OR=114 kU/L 62  Allergen, D pternoyssinus,d7 kU/L 0.15 (H)  Cat Dander kU/L <0.10  Dog Dander kU/L <0.10  [place] Grass kU/L 1.40 (H)  Johnson Grass kU/L 1.50 (H)  Timothy Grass kU/L 1.69 (H)  Cockroach kU/L 0.76 (H)  Aspergillus fumigatus, m3 kU/L <0.10  Allergen, Comm Silver Wendee Copp, t9 kU/L 0.71 (H)  Allergen, Cottonwood, t14 kU/L 0.98 (H)  Elm IgE kU/L 1.61 (H)  Allergen, Mulberry, t76 kU/L 0.81 (H)  Allergen, Oak,t7 kU/L 1.55 (H)  COMMON RAGWEED (SHORT) (W1) IGE kU/L 1.65 (H)  Allergen, Mouse Urine Protein, e78 kU/L <0.10  D. farinae kU/L 0.15 (H)  Allergen, Cedar tree, t12 kU/L 0.95 (H)  Box Elder IgE kU/L 1.59 (H)  Rough Pigweed  IgE kU/L 1.26 (H)  (H): Data is abnormally high CT Chest data  No results found.  Narrative & Impression  CLINICAL DATA:  Persistent cough for 12 years, nonsmoker, recent COVID exacerbated cough   EXAM: CT CHEST WITHOUT CONTRAST    TECHNIQUE: Multidetector CT imaging of the chest was performed following the standard protocol without intravenous contrast. High resolution imaging of the lungs, as well as inspiratory and expiratory imaging, was performed.   COMPARISON:  None.   FINDINGS: Cardiovascular: No significant vascular findings. Normal heart size. Three-vessel coronary artery calcifications. No pericardial effusion.   Mediastinum/Nodes: No enlarged mediastinal, hilar, or axillary lymph nodes. Thyroid gland, trachea, and esophagus demonstrate no significant findings.   Lungs/Pleura: There are innumerable tiny centrilobular pulmonary nodules scattered throughout the lungs. Lobular air trapping on expiratory  phase imaging. No pleural effusion or pneumothorax.   Upper Abdomen: No acute abnormality.   Musculoskeletal: No chest wall mass or suspicious bone lesions identified.   IMPRESSION: 1. There are innumerable tiny centrilobular pulmonary nodules scattered throughout the lungs. In conjunction with air trapping, findings are suggestive of hypersensitivity pneumonitis. No evidence of fibrosis at this time. 2. Coronary artery disease.     Electronically Signed   By: Delanna Ahmadi M.D.   On: 01/20/2021 09:49     MDD case connferece Jan 2023  "65y old came for chronic cough that was c/w cough neuropathy and ace inhibitor. Better partially after stopping ace inhibitor. HRCT shows features of HP. Tells me he was living till 2y ago in a house that has mold smell. Now living with GF - has feather blanket. Perioidcally goes to old house.  QUESTION- how confident are we wtih HP features? What is the best biopsy method? Should we do a biopsy at all?" HRCT: 01/19/2021  official read - is HP dx.  ENTRIKIN READ: small pumonary nodules in random distribution. Does not strike as centrilobular predomonnant. Some very mild AT. Overall features are not c/w  HP. Tyrone Apple with Linna Hoff and disagrees with HP dx.  REC: Follow with 1 year CT. No need for biopsy   Clinical discussion: jsut follow along. If patient petrified of ILD - do BAL but no bx  OV 04/21/2021  Subjective:  Patient ID: Robert Mckinney, male , DOB: Jan 31, 1956 , age 38 y.o. , MRN: [id number] , ADDRESS: [address] [place] [address] PCP Lillard Anes, MD Patient Care Team: Lillard Anes, MD as PCP - General (Family Medicine)  This Provider for this visit: Treatment Team:  Attending Provider: Brand Males, MD    04/21/2021 -   Chief Complaint  Patient presents with   Follow-up    Pt states since last visit, his cough had gotten better until he was prescribed rosuvastatin which that made the cough worse which that med was then stopped.   Follow-up chronic cough with features of ACE inhibitor and irritable larynx syndrome Follow-up small nodules in the upper lobe [formal radiology report suggestive of HP but multidisciplinary case conference report is inconsistent with HP] Has history of mold exposure  HPI Robert Mckinney 66 y.o. -presents with Robert Mckinney his significant other.  He tells me that since last visit his cough is actually better although on the RSI cough score it might be worse at the same.  Since last seeing me he did see cardiology for coronary artery calcification and apparently there was discussion about whether he should have a stent or just expectant follow-up because he is clinically asymptomatic.  Nevertheless because of his cough which is he is feeling better.  Although in the interim when he took rosuvastatin his cough got significantly worse and he had to stop it.  Otherwise he is feeling fine.  He says that there are 2 types of cough 1 that comes from the throat and sometimes from the chest but it is a throat component that is significantly worse.  In the last few days he has noticed some loosening of the cough and some phlegm and so overall he feels the cough is significantly better.   He is content taking a more conservative approach  In terms of the nodules did discuss the case conference.  The case conference disagreed with the official radiologist that this was consistent with HP.  I shared this  with him.  The general consensus that is to follow him along and to only consider bronchoscopy with lavage if he is very anxious about it.  He is perfectly fine at this point watching clinically.  He wants to get through his cardiology work-up.  I also indicated that I would like to see resolution of the cardiac issue before proceeding with a bronchoscopy although bronchoscopy is not contraindicated at this point.  His blood work for serology and hypersensitive pneumonitis panel is negative.  CT Chest data  No results found.   Dr Lorenza Cambridge Reflux Symptom Index (> 13-15 suggestive of LPR cough) 0 -> 5  =  none ->severe problem 12/23/2020  02/11/2021 Bettter after stoppin ace inhibitor 04/21/2021 Feels better  Hoarseness of problem with voice 0 0 0  Clearing  Of Throat 2 0 3  Excess throat mucus or feeling of post nasal drip 0 2 5  Difficulty swallowing food, liquid or tablets 0 0 0  Cough after eating or lying down 2 4 3   Breathing difficulties or choking episodes 2 3 0  Troublesome or annoying cough 5 3 4   Sensation of something sticking in throat or lump in throat 0 0 5  Heartburn, chest pain, indigestion, or stomach acid coming up 2 4 0  TOTAL 13 16 20     No results found for: NITRICOXIDE   PFT  PFT Results Latest Ref Rng & Units 02/10/2021  FVC-Pre L 3.55  FVC-Predicted Pre % 77  FVC-Post L 3.60  FVC-Predicted Post % 78  Pre FEV1/FVC % % 81  Post FEV1/FCV % % 84  FEV1-Pre L 2.87  FEV1-Predicted Pre % 83  FEV1-Post L 3.01  DLCO uncorrected ml/min/mmHg 32.70  DLCO UNC% % 122  DLCO corrected ml/min/mmHg 30.53  DLCO COR %Predicted % 114  DLVA Predicted % 127  TLC L 7.11  TLC % Predicted % 101  RV % Predicted % 88     Latest Reference Range & Units  02/10/21 15:39  A.Fumigatus #1 Abs Negative  Negative  A. Pullulans Abs Negative  Negative  Micropolyspora faeni, IgG Negative  Negative  Pigeon Serum Abs Negative  Negative  Thermoact. Saccharii Negative  Negative  Thermoactinomyces vulgaris, IgG Negative  Negative    Latest Reference Range & Units 02/10/21 15:39  Angiotensin-Converting Enzyme 9 - 67 U/L 25  Cyclic Citrullin Peptide Ab UNITS <16  ds DNA Ab IU/mL <1  RA Latex Turbid. <14 IU/mL <14    has a past medical history of Adjustment insomnia, Benign essential hypertension, Mixed hyperlipidemia, Obstructive sleep apnea (adult) (pediatric), and Other testicular dysfunction.   reports that he has never smoked. He has never used smokeless tobacco.  Past Surgical History:  Procedure Laterality Date   APPENDECTOMY     KNEE SURGERY Bilateral    TONSILLECTOMY      Allergies  Allergen Reactions   Rosuvastatin Calcium Cough    Immunization History  Administered Date(s) Administered   Fluad Quad(high Dose 65+) 12/12/2018, 11/24/2020   Influenza Inj Mdck Quad Pf 11/13/2019   Moderna Sars-Covid-2 Vaccination 06/23/2020   PFIZER(Purple Top)SARS-COV-2 Vaccination 04/14/2019, 05/09/2019, 12/04/2019   Tdap 11/27/2019   Zoster Recombinat (Shingrix) 11/27/2019, 01/27/2020    No family history on file.   Current Outpatient Medications:    aspirin EC 81 MG tablet, Take 1 tablet (81 mg total) by mouth daily. Swallow whole., Disp: 30 tablet, Rfl: 11   fluticasone furoate-vilanterol (BREO ELLIPTA) 100-25 MCG/ACT AEPB, Inhale 1 puff into the lungs daily., Disp:  60 each, Rfl: 5   losartan (COZAAR) 50 MG tablet, Take 1 tablet (50 mg total) by mouth daily., Disp: 90 tablet, Rfl: 0   sildenafil (VIAGRA) 100 MG tablet, Take 0.5-1 tablets (50-100 mg total) by mouth daily as needed for erectile dysfunction., Disp: 10 tablet, Rfl: 6   traZODone (DESYREL) 150 MG tablet, Take 1 tablet (150 mg total) by mouth at bedtime., Disp: 90 tablet, Rfl:  2   metoprolol succinate (TOPROL-XL) 50 MG 24 hr tablet, Take 1 tablet (50 mg total) by mouth daily. Take with or immediately following a meal. (Patient not taking: Reported on 04/21/2021), Disp: 90 tablet, Rfl: 3      Objective:   Vitals:   04/21/21 1510  BP: 122/72  Pulse: 63  Temp: 98.3 F (36.8 C)  TempSrc: Oral  SpO2: 97%  Weight: 237 lb (107.5 kg)  Height: 5' 9.5" (1.765 m)    Estimated body mass index is 34.5 kg/m as calculated from the following:   Height as of this encounter: 5' 9.5" (1.765 m).   Weight as of this encounter: 237 lb (107.5 kg).  @WEIGHTCHANGE @  [employer]   04/21/21 1510  Weight: 237 lb (107.5 kg)     Physical Exam  General: No distress. Looks well Neuro: Alert and Oriented x 3. GCS 15. Speech normal Psych: Pleasant Resp:  Barrel Chest - n.  Wheeze - no, Crackles - no, No overt respiratory distress CVS: Normal heart sounds. Murmurs - no Ext: Stigmata of Connective Tissue Disease - o HEENT: Normal upper airway. PEERL +. No post nasal drip        Assessment:       ICD-10-CM   1. Chronic cough  R05.3 Pulmonary function test    Nitric oxide    CT Chest High Resolution         Plan:     Patient Instructions  Chronic cough - ACE-inhibitor cough Mold exposure Environmental allergies Abnormal CT scan of lung  -Glad your cough is better after stopping ACE inhibitor but there is still residual cough though compared to last visit it is improving  -Had a case conference in January 2023 the radiologist felt the risk for more related hypersensitive pneumonitis is very low  -Blood work for autoimmune antibodies and hypertension pneumonitis panel was negative   Plan  - Took a shared decision making to have a surveillance approach as opposed to doing bronchoscopy with lavage at this point  -Do full pulmonary function test in 6 months -Do exhaled nitric oxide test in 6 months  -Hold off on bronchoscopy or lung biopsy at this  point.  -Do 1 year follow-up high-resolution CT scan of the chest in November 2023  -Okay to stop Breo for 1 month and see how the cough does without the Breo  -If cough continues to persist despite all interventions we could try allergy shots through allergy referral given positive allergy test  Coronary artery calcification seen on CAT scan  -Noted ongoing work-up with possible consideration of stent with Dr. Percival Spanish  Plan  - According to Dr. Percival Spanish  Follow-up -61-month follow-up and 15-minute visit but after pulmonary function test AND nitric oxide testing    SIGNATURE    Dr. Brand Males, M.D., F.C.C.P,  Pulmonary and Critical Care Medicine Staff Physician, [place] Director - Interstitial Lung Disease  Program  Pulmonary [place] [facility] [place], [place], [address]  Pager: [phone], If no answer or  between  15:00h - 7:00h: call [phone] Telephone: [phone]  5:29 PM 04/21/2021

## 2021-04-21 NOTE — Patient Instructions (Addendum)
Chronic cough - ACE-inhibitor cough Mold exposure Environmental allergies Abnormal CT scan of lung  -Glad your cough is better after stopping ACE inhibitor but there is still residual cough though compared to last visit it is improving  -Had a case conference in January 2023 the radiologist felt the risk for more related hypersensitive pneumonitis is very low  -Blood work for autoimmune antibodies and hypertension pneumonitis panel was negative   Plan  - Took a shared decision making to have a surveillance approach as opposed to doing bronchoscopy with lavage at this point  -Do full pulmonary function test in 6 months -Do exhaled nitric oxide test in 6 months  -Hold off on bronchoscopy or lung biopsy at this point.  -Do 1 year follow-up high-resolution CT scan of the chest in November 2023  -Okay to stop Breo for 1 month and see how the cough does without the Breo  -If cough continues to persist despite all interventions we could try allergy shots through allergy referral given positive allergy test  Coronary artery calcification seen on CAT scan  -Noted ongoing work-up with possible consideration of stent with Dr. Percival Spanish  Plan  - According to Dr. Percival Spanish  Follow-up -76-month follow-up and 15-minute visit but after pulmonary function test AND nitric oxide testing

## 2021-05-10 DIAGNOSIS — E785 Hyperlipidemia, unspecified: Secondary | ICD-10-CM | POA: Insufficient documentation

## 2021-05-10 DIAGNOSIS — I2510 Atherosclerotic heart disease of native coronary artery without angina pectoris: Secondary | ICD-10-CM | POA: Insufficient documentation

## 2021-05-10 NOTE — Progress Notes (Signed)
?  ?Cardiology Office Note ? ? ?Date:  05/11/2021  ? ?ID:  Robert Mckinney, DOB November 01, 1955, MRN [id number] ? ?PCP:  Lillard Anes, MD  ?Cardiologist:   Minus Breeding, MD ?Referring:  Lillard Anes,* ? ? ?Chief Complaint  ?Patient presents with  ? Coronary Artery Disease  ? ? ?History of Present Illness: ?Robert Mckinney is a 66 y.o. male who presents for evaluation of CAD.  He was noted to have coronary calcium on a recent CT.  I sent him for a POET (Plain Old Exercise Treadmill) which was equivocal.  Coronary CT demonstrated LAD proximal stenosis that was said to be 70 to 99%, intermediate 25 to 49% stenosis, D2 50 to 69% stenosis and mild plaque elsewhere.  His calcium score was 937.  However, his FFR was positive only in the distal vessel.  He had gone greater than 9 minutes on the treadmill without ST changes.  He had no symptoms.  We had a long conversation it was decided to manage him medically with optimal medical therapy.  I did review his films with some of my partners. ? ?Since last seeing him he has done well.  The patient denies any new symptoms such as chest discomfort, neck or arm discomfort. There has been no new shortness of breath, PND or orthopnea. There have been no reported palpitations, presyncope or syncope.  He is very active and can do an hour or 2 of vigorous exercise without any symptoms.  He did have coughing when I increased his Crestor and so he stopped taking this.  He is tolerating his beta-blocker which I added.  Blood pressures have been well controlled. ? ? ?Past Medical History:  ?Diagnosis Date  ? Adjustment insomnia   ? Benign essential hypertension   ? Mixed hyperlipidemia   ? Obstructive sleep apnea (adult) (pediatric)   ? Other testicular dysfunction   ? ? ?Past Surgical History:  ?Procedure Laterality Date  ? APPENDECTOMY    ? KNEE SURGERY Bilateral   ? TONSILLECTOMY    ? ? ? ?Current Outpatient Medications  ?Medication Sig Dispense Refill  ? aspirin EC 81 MG tablet  Take 1 tablet (81 mg total) by mouth daily. Swallow whole. 30 tablet 11  ? atorvastatin (LIPITOR) 80 MG tablet Take 1 tablet (80 mg total) by mouth daily. 90 tablet 3  ? fluticasone furoate-vilanterol (BREO ELLIPTA) 100-25 MCG/ACT AEPB Inhale 1 puff into the lungs daily. 60 each 5  ? losartan (COZAAR) 50 MG tablet Take 1 tablet (50 mg total) by mouth daily. 90 tablet 0  ? metoprolol succinate (TOPROL-XL) 50 MG 24 hr tablet Take 1 tablet (50 mg total) by mouth daily. Take with or immediately following a meal. 90 tablet 3  ? sildenafil (VIAGRA) 100 MG tablet Take 0.5-1 tablets (50-100 mg total) by mouth daily as needed for erectile dysfunction. 10 tablet 6  ? traZODone (DESYREL) 150 MG tablet Take 1 tablet (150 mg total) by mouth at bedtime. 90 tablet 2  ? ?No current facility-administered medications for this visit.  ? ? ?Allergies:   Rosuvastatin calcium  ? ? ?ROS:  Please see the history of present illness.   Otherwise, review of systems are positive for none.   All other systems are reviewed and negative.  ? ? ?PHYSICAL EXAM: ?VS:  BP 128/76   Pulse (!) 56   Ht 5' 9.05" (1.754 m)   Wt 233 lb (105.7 kg)   SpO2 98%   BMI 34.36 kg/m?  ,  BMI Body mass index is 34.36 kg/m?. ?GENERAL:  Well appearing ?NECK:  No jugular venous distention, waveform within normal limits, carotid upstroke brisk and symmetric, no bruits, no thyromegaly ?LUNGS:  Clear to auscultation bilaterally ?CHEST:  Unremarkable ?HEART:  PMI not displaced or sustained,S1 and S2 within normal limits, no S3, no S4, no clicks, no rubs, no murmurs ?ABD:  Flat, positive bowel sounds normal in frequency in pitch, no bruits, no rebound, no guarding, no midline pulsatile mass, no hepatomegaly, no splenomegaly ?EXT:  2 plus pulses throughout, no edema, no cyanosis no clubbing ? ? ?EKG:  EKG is not ordered today. ? ? ?Recent Labs: ?09/20/2020: ALT 19 ?12/23/2020: Hemoglobin 17.4; Platelets 176.0 ?04/11/2021: BUN 18; Potassium 4.1; Sodium 140 ?04/12/2021:  Creatinine, Ser 1.10  ? ? ?Lipid Panel ?   ?Component Value Date/Time  ? CHOL 127 09/20/2020 1035  ? TRIG 133 09/20/2020 1035  ? HDL 32 (L) 09/20/2020 1035  ? CHOLHDL 4.0 09/20/2020 1035  ? [place] 71 09/20/2020 1035  ? LDLDIRECT 84 04/21/2019 0956  ? ?  ? ?Wt Readings from Last 3 Encounters:  ?05/11/21 233 lb (105.7 kg)  ?04/21/21 237 lb (107.5 kg)  ?03/17/21 242 lb (109.8 kg)  ?  ? ? ?Other studies Reviewed: ?Additional studies/ records that were reviewed today include: Labs  ?Review of the above records demonstrates:  Please see elsewhere in the note.   ? ? ?ASSESSMENT AND PLAN: ? ?CAD   we again had a long discussion about this.  I reviewed his stress test again and it was positive only at 8 and half minutes.  He has absolutely no symptoms.  There are no high risk findings.  He is participating in risk reduction and we are maximizing his medical therapy.  He is aware to let me know if he has any symptoms going forward.  ? ?DYSLIPIDEMIA: LDL goal is 50.  I am going to switch him to 80 mg of Lipitor since he did not tolerate Crestor.  If he does not tolerate Lipitor he will need a peak PCSK9 inhibitor and will check a lipid profile in about 10 weeks.  ? ?HTN: His blood pressure is at target.  I cannot increase his beta-blocker with his bradycardia.  No change in therapy.  ? ?OBESTIY: We talked about a plant-based Mediterranean diet. ? ? ?Current medicines are reviewed at length with the patient today.  The patient does not have concerns regarding medicines. ? ?The following changes have been made: As above ? ?Labs/ tests ordered today include:  ? ?Orders Placed This Encounter  ?Procedures  ? Lipid panel  ? ? ? ?Disposition:   FU with me in 3 months ? ? ?Signed, ?Minus Breeding, MD  ?05/11/2021 11:29 AM    ?[place] ? ? ? ?

## 2021-05-11 ENCOUNTER — Encounter: Payer: Self-pay | Admitting: Cardiology

## 2021-05-11 ENCOUNTER — Ambulatory Visit (INDEPENDENT_AMBULATORY_CARE_PROVIDER_SITE_OTHER): Payer: 59 | Admitting: Cardiology

## 2021-05-11 ENCOUNTER — Other Ambulatory Visit: Payer: Self-pay

## 2021-05-11 VITALS — BP 128/76 | HR 56 | Ht 69.05 in | Wt 233.0 lb

## 2021-05-11 DIAGNOSIS — E785 Hyperlipidemia, unspecified: Secondary | ICD-10-CM | POA: Diagnosis not present

## 2021-05-11 DIAGNOSIS — I10 Essential (primary) hypertension: Secondary | ICD-10-CM | POA: Diagnosis not present

## 2021-05-11 DIAGNOSIS — I2510 Atherosclerotic heart disease of native coronary artery without angina pectoris: Secondary | ICD-10-CM

## 2021-05-11 MED ORDER — ATORVASTATIN CALCIUM 80 MG PO TABS: 80.0000 mg | ORAL_TABLET | Freq: Every day | ORAL | 3 refills | Status: DC

## 2021-05-11 NOTE — Patient Instructions (Addendum)
Medication Instructions:  ? ? Atorvastatin 80 mg at bedtime  ? ?*If you need a refill on your cardiac medications before your next appointment, please call your pharmacy* ? ? ?Lab Work: ?Lipid in 10 weeks -- 07/20/21 ? ?If you have labs (blood work) drawn today and your tests are completely normal, you will receive your results only by: ?MyChart Message (if you have MyChart) OR ?A paper copy in the mail ?If you have any lab test that is abnormal or we need to change your treatment, we will call you to review the results. ? ? ?Testing/Procedures: ?Not needed ? ? ?Follow-Up: ?At [facility], you and your health needs are our priority.  As part of our continuing mission to provide you with exceptional heart care, we have created designated Provider Care Teams.  These Care Teams include your primary Cardiologist (physician) and Advanced Practice Providers (APPs -  Physician Assistants and Nurse Practitioners) who all work together to provide you with the care you need, when you need it. ? ?  ? ?Your next appointment:   ?3 month(s) ? ?The format for your next appointment:   ?In Person ? ?Provider:   ?Minus Breeding, MD  ? ? ? ?

## 2021-05-12 ENCOUNTER — Ambulatory Visit: Payer: 59 | Admitting: Cardiology

## 2021-05-13 ENCOUNTER — Encounter: Payer: Self-pay | Admitting: Internal Medicine

## 2021-05-13 MED ORDER — AMOXICILLIN-POT CLAVULANATE 875-125 MG PO TABS: 1.0000 | ORAL_TABLET | Freq: Two times a day (BID) | ORAL | 0 refills | Status: DC

## 2021-05-13 MED ORDER — FLUTICASONE FUROATE-VILANTEROL 100-25 MCG/ACT IN AEPB: 1.0000 | INHALATION_SPRAY | Freq: Every day | RESPIRATORY_TRACT | 5 refills | Status: DC

## 2021-05-13 NOTE — Telephone Encounter (Signed)
Called and spoke with patient. He stated that he would like to have the RX sent to [facility] on [address]. I advised him that I would go ahead and send in the medication for him. He also stated that he needed a refill on his Breo. Will send this in as well.  ? ?Nothing further needed at time of call.  ?

## 2021-05-13 NOTE — Telephone Encounter (Signed)
?  I called the patient and spoke to the patient.  He tells me that in the past when he is taking amoxicillin the cough is actually improved.  This time has been taking amoxicillin for the last few days and this is helped.  He wants something stronger.  We talked about Z-Pak we talked about Augmentin.  He prefers to take his Augmentin.  His significant other also came on the phone.  We took a shared decision making to do Augmentin 875 mg bid x 5 days ? ?Did indicate to him that this pattern of responding to the antibiotics suggested might be colonized or some kind of airway inflammation causing his cough and if this pattern persists and records he would need a early bronchoscopy.  He understood ? ?He says he will stay in touch with me via MyChart message.  At this point in time he wants Augmentin.  He understands diarrhea risk.   ? ?Plan ?- Augmentin 875 mg twice daily x5 days ?

## 2021-05-13 NOTE — Telephone Encounter (Signed)
Received the following message from patient: ? ?"On my first visit with you, you said that I had some type of infection in my lungs per the lung CT scan.  No antibiotic or steroid was ever prescribed pending the results of the lung biopsy.  Then you decided that the lung biopsy would be put on hold because you and your colleagues decided that my cough was not caused by mold and mildew in my home and we would continue to monitor my issues for 3 mths.   ? I am continuing to cough and I have noticed that if I take Amoxicillin 500 mg. for a few days, my cough clears up temporarily and my throat is not irritated at all.  When I go off amoxicillin my cough returns.  When I go back on Amoxicillin, cough dissipates again.  I'm not sure but what if given a different /stronger antibiotic, that my cough my not go away permanently.  I think that since I have stopped the Rovustatin weeks ago, clearly I was allergic to it and my cough has greatly lessened, but of course still not gone.  I do feel that I could benefit from an antibiotic.  What are your thoughts.?  Thank you for your time. ?Robert Mckinney" ? ?MR, can you please advise? Thanks.  ?

## 2021-05-30 ENCOUNTER — Other Ambulatory Visit: Payer: Self-pay | Admitting: Legal Medicine

## 2021-05-31 NOTE — Telephone Encounter (Signed)
Refill sent to pharmacy.   

## 2021-06-24 ENCOUNTER — Other Ambulatory Visit: Payer: Self-pay | Admitting: Legal Medicine

## 2021-06-24 DIAGNOSIS — I10 Essential (primary) hypertension: Secondary | ICD-10-CM

## 2021-06-28 ENCOUNTER — Ambulatory Visit (INDEPENDENT_AMBULATORY_CARE_PROVIDER_SITE_OTHER): Payer: 59 | Admitting: Legal Medicine

## 2021-06-28 ENCOUNTER — Encounter: Payer: Self-pay | Admitting: Legal Medicine

## 2021-06-28 VITALS — BP 112/62 | HR 52 | Resp 18 | Ht 69.0 in | Wt 239.0 lb

## 2021-06-28 DIAGNOSIS — N529 Male erectile dysfunction, unspecified: Secondary | ICD-10-CM

## 2021-06-28 DIAGNOSIS — Z6834 Body mass index (BMI) 34.0-34.9, adult: Secondary | ICD-10-CM | POA: Diagnosis not present

## 2021-06-28 DIAGNOSIS — I2510 Atherosclerotic heart disease of native coronary artery without angina pectoris: Secondary | ICD-10-CM

## 2021-06-28 DIAGNOSIS — E782 Mixed hyperlipidemia: Secondary | ICD-10-CM | POA: Diagnosis not present

## 2021-06-28 DIAGNOSIS — I10 Essential (primary) hypertension: Secondary | ICD-10-CM

## 2021-06-28 DIAGNOSIS — G4733 Obstructive sleep apnea (adult) (pediatric): Secondary | ICD-10-CM

## 2021-06-28 MED ORDER — SILDENAFIL CITRATE 100 MG PO TABS: 50.0000 mg | ORAL_TABLET | Freq: Every day | ORAL | 6 refills | Status: DC | PRN

## 2021-06-28 NOTE — Progress Notes (Addendum)
? ?Subjective:  ?Patient ID: Robert Mckinney, male    DOB: Jul 14, 1955  Age: 66 y.o. MRN: [id number] ? ?Chief Complaint  ?Patient presents with  ? Hypertension  ? ? ?Hypertension ?Pertinent negatives include no chest pain, headaches, palpitations or shortness of breath.  ?I last saw on 9/22, he did not get his blood work at that time. ?From cardiology ?He was noted to have coronary calcium on a recent CT.  I sent him for a POET (Plain Old Exercise Treadmill) which was equivocal.  Coronary CT demonstrated LAD proximal stenosis that was said to be 70 to 99%, intermediate 25 to 49% stenosis, D2 50 to 69% stenosis and mild plaque elsewhere.  His calcium score was 937.  However, his FFR was positive only in the distal vessel.  He had gone greater than 9 minutes on the treadmill without ST changes.  He had no symptoms.  We had a long conversation it was decided to manage him medically with optimal medical therapy.  I did review his films with some of my partners ?Cardiology switched him to atorvastatin from crestor. Losartan stopped. ? ?Patient presents with hyperlipidemia.  Compliance with treatment has been good; patient takes medicines as directed, maintains low cholesterol diet, follows up as directed, and maintains exercise regimen.  Patient is using atorvastatin without problems.  ? ?Using CPAP consistently every night and medically benefiting from its use.  ?  ?Current Outpatient Medications on File Prior to Visit  ?Medication Sig Dispense Refill  ? aspirin EC 81 MG tablet Take 1 tablet (81 mg total) by mouth daily. Swallow whole. 30 tablet 11  ? atorvastatin (LIPITOR) 80 MG tablet Take 1 tablet (80 mg total) by mouth daily. 90 tablet 3  ? fluticasone furoate-vilanterol (BREO ELLIPTA) 100-25 MCG/ACT AEPB Inhale 1 puff into the lungs daily. 60 each 5  ? losartan (COZAAR) 50 MG tablet Take 1 tablet (50 mg total) by mouth daily. 90 tablet 0  ? metoprolol succinate (TOPROL-XL) 50 MG 24 hr tablet Take 1 tablet (50 mg total)  by mouth daily. Take with or immediately following a meal. 90 tablet 3  ? traZODone (DESYREL) 150 MG tablet Take 1 tablet (150 mg total) by mouth at bedtime. 90 tablet 2  ? ?No current facility-administered medications on file prior to visit.  ? ?Past Medical History:  ?Diagnosis Date  ? Adjustment insomnia   ? Benign essential hypertension   ? Mixed hyperlipidemia   ? Obstructive sleep apnea (adult) (pediatric)   ? Other testicular dysfunction   ? ?Past Surgical History:  ?Procedure Laterality Date  ? APPENDECTOMY    ? KNEE SURGERY Bilateral   ? TONSILLECTOMY    ?  ?History reviewed. No pertinent family history. ?Social History  ? ?Socioeconomic History  ? Marital status: Widowed  ?  Spouse name: Not on file  ? Number of children: 2  ? Years of education: Not on file  ? Highest education level: Not on file  ?Occupational History  ? Occupation: Sales  ?Tobacco Use  ? Smoking status: Never  ? Smokeless tobacco: Never  ?Substance and Sexual Activity  ? Alcohol use: Yes  ?  Alcohol/week: 1.0 standard drink  ?  Types: 1 Cans of beer per week  ?  Comment: Once a week  ? Drug use: No  ? Sexual activity: Yes  ?  Partners: Female  ?Other Topics Concern  ? Not on file  ?Social History Narrative  ? Lives with girlfriend.  Two sons and 4  grands.  Widow.   ? ?Social Determinants of Health  ? ?Financial Resource Strain: Not on file  ?Food Insecurity: Not on file  ?Transportation Needs: Not on file  ?Physical Activity: Not on file  ?Stress: Not on file  ?Social Connections: Not on file  ? ? ?Review of Systems  ?Constitutional:  Negative for appetite change, fatigue and fever.  ?HENT:  Negative for congestion, ear pain, sinus pressure and sore throat.   ?Eyes:  Negative for pain.  ?Respiratory:  Negative for cough, shortness of breath and wheezing.   ?Cardiovascular:  Negative for chest pain and palpitations.  ?Gastrointestinal:  Negative for abdominal pain, constipation, diarrhea, nausea and vomiting.  ?Genitourinary:  Negative  for dysuria and frequency.  ?Musculoskeletal:  Negative for arthralgias, back pain, joint swelling and myalgias.  ?Skin:  Negative for rash.  ?Neurological:  Negative for dizziness, weakness and headaches.  ?Psychiatric/Behavioral:  Negative for dysphoric mood. The patient is not nervous/anxious.   ? ? ?Objective:  ?BP 112/62   Pulse (!) 52   Resp 18   Ht '5\' 9"'$  (1.753 m)   Wt 239 lb (108.4 kg)   SpO2 97%   BMI 35.29 kg/m?  ? ? ?  06/28/2021  ?  8:51 AM 05/11/2021  ?  9:52 AM 04/21/2021  ?  3:10 PM  ?BP/Weight  ?Systolic BP 229 798 921  ?Diastolic BP 62 76 72  ?Wt. (Lbs) 239 233 237  ?BMI 35.29 kg/m2 34.36 kg/m2 34.5 kg/m2  ? ? ?Physical Exam ?Constitutional:   ?   General: He is in acute distress.  ?   Appearance: Normal appearance. He is obese.  ?HENT:  ?   Head: Normocephalic and atraumatic.  ?   Right Ear: Tympanic membrane normal.  ?   Left Ear: Tympanic membrane normal.  ?   Nose: Nose normal.  ?   Mouth/Throat:  ?   Mouth: Mucous membranes are moist.  ?   Pharynx: Oropharynx is clear.  ?Eyes:  ?   Conjunctiva/sclera: Conjunctivae normal.  ?   Pupils: Pupils are equal, round, and reactive to light.  ?Cardiovascular:  ?   Rate and Rhythm: Normal rate and regular rhythm.  ?   Pulses: Normal pulses.  ?   Heart sounds: Normal heart sounds. No murmur heard. ?  No gallop.  ?Pulmonary:  ?   Effort: Pulmonary effort is normal. No respiratory distress.  ?   Breath sounds: Normal breath sounds. No wheezing.  ?Abdominal:  ?   General: Abdomen is flat. Bowel sounds are normal. There is no distension.  ?   Tenderness: There is no abdominal tenderness.  ?Musculoskeletal:     ?   General: Normal range of motion.  ?   Cervical back: Normal range of motion and neck supple.  ?   Right lower leg: No edema.  ?   Left lower leg: No edema.  ?Skin: ?   General: Skin is warm and dry.  ?   Capillary Refill: Capillary refill takes less than 2 seconds.  ?Neurological:  ?   Mental Status: He is alert and oriented to person, place, and  time. Mental status is at baseline.  ?Psychiatric:     ?   Mood and Affect: Mood normal.     ?   Behavior: Behavior normal.  ? ? ?  ? ?Lab Results  ?Component Value Date  ? WBC 5.4 06/28/2021  ? HGB 14.0 06/28/2021  ? HCT 40.2 06/28/2021  ? PLT 148 (L)  06/28/2021  ? GLUCOSE 104 (H) 06/28/2021  ? CHOL 126 06/28/2021  ? TRIG 321 (H) 06/28/2021  ? HDL 37 (L) 06/28/2021  ? LDLDIRECT 84 04/21/2019  ? [place] 41 06/28/2021  ? ALT 25 06/28/2021  ? AST 17 06/28/2021  ? NA 142 06/28/2021  ? K 4.5 06/28/2021  ? CL 106 06/28/2021  ? CREATININE 1.23 06/28/2021  ? BUN 13 06/28/2021  ? CO2 24 06/28/2021  ? TSH 1.550 11/27/2019  ? INR 0.98 12/13/2009  ? ? ? ? ?Assessment & Plan:  ? ?Problem List Items Addressed This Visit   ? ?  ? Cardiovascular and Mediastinum  ? Benign essential hypertension - Primary  ? Relevant Medications  ? sildenafil (VIAGRA) 100 MG tablet  ? Other Relevant Orders  ? CBC with Differential/Platelet  ? Comprehensive metabolic panel ?An individual hypertension care plan was established and reinforced today.  The patient's status was assessed using clinical findings on exam and labs or diagnostic tests. The patient's success at meeting treatment goals on disease specific evidence-based guidelines and found to be well controlled. ?SELF MANAGEMENT: The patient and I together assessed ways to personally work towards obtaining the recommended goals. ?RECOMMENDATIONS: avoid decongestants found in common cold remedies, decrease consumption of alcohol, perform routine monitoring of BP with home BP cuff, exercise, reduction of dietary salt, take medicines as prescribed, try not to miss doses and quit smoking.  Regular exercise and maintaining a healthy weight is needed.  Stress reduction may help. ?A CLINICAL SUMMARY including written plan identify barriers to care unique to individual due to social or financial issues.  We attempt to mutually creat solutions for individual and family understanding.  ?  ? Coronary  artery disease involving native coronary artery of native heart without angina pectoris  ? Relevant Medications  ? sildenafil (VIAGRA) 100 MG tablet ?An individual plan was formulated based on patient history and

## 2021-06-29 LAB — CBC WITH DIFFERENTIAL/PLATELET
Basophils Absolute: 0 10*3/uL (ref 0.0–0.2)
Basos: 1 %
EOS (ABSOLUTE): 0.2 10*3/uL (ref 0.0–0.4)
Eos: 4 %
Hematocrit: 40.2 % (ref 37.5–51.0)
Hemoglobin: 14 g/dL (ref 13.0–17.7)
Immature Grans (Abs): 0 10*3/uL (ref 0.0–0.1)
Immature Granulocytes: 0 %
Lymphocytes Absolute: 1.5 10*3/uL (ref 0.7–3.1)
Lymphs: 28 %
MCH: 32 pg (ref 26.6–33.0)
MCHC: 34.8 g/dL (ref 31.5–35.7)
MCV: 92 fL (ref 79–97)
Monocytes Absolute: 0.5 10*3/uL (ref 0.1–0.9)
Monocytes: 10 %
Neutrophils Absolute: 3.1 10*3/uL (ref 1.4–7.0)
Neutrophils: 57 %
Platelets: 148 10*3/uL — ABNORMAL LOW (ref 150–450)
RBC: 4.38 x10E6/uL (ref 4.14–5.80)
RDW: 13.4 % (ref 11.6–15.4)
WBC: 5.4 10*3/uL (ref 3.4–10.8)

## 2021-06-29 LAB — LIPID PANEL
Chol/HDL Ratio: 3.4 ratio (ref 0.0–5.0)
Cholesterol, Total: 126 mg/dL (ref 100–199)
HDL: 37 mg/dL — ABNORMAL LOW (ref >39)
LDL Chol Calc (NIH): 41 mg/dL (ref 0–99)
Triglycerides: 321 mg/dL — ABNORMAL HIGH (ref 0–149)
VLDL Cholesterol Cal: 48 mg/dL — ABNORMAL HIGH (ref 5–40)

## 2021-06-29 LAB — CARDIOVASCULAR RISK ASSESSMENT

## 2021-06-29 LAB — COMPREHENSIVE METABOLIC PANEL
ALT: 25 IU/L (ref 0–44)
AST: 17 IU/L (ref 0–40)
Albumin/Globulin Ratio: 2.3 — ABNORMAL HIGH (ref 1.2–2.2)
Albumin: 4.5 g/dL (ref 3.8–4.8)
Alkaline Phosphatase: 73 IU/L (ref 44–121)
BUN/Creatinine Ratio: 11 (ref 10–24)
BUN: 13 mg/dL (ref 8–27)
Bilirubin Total: 1 mg/dL (ref 0.0–1.2)
CO2: 24 mmol/L (ref 20–29)
Calcium: 10.9 mg/dL — ABNORMAL HIGH (ref 8.6–10.2)
Chloride: 106 mmol/L (ref 96–106)
Creatinine, Ser: 1.23 mg/dL (ref 0.76–1.27)
Globulin, Total: 2 g/dL (ref 1.5–4.5)
Glucose: 104 mg/dL — ABNORMAL HIGH (ref 70–99)
Potassium: 4.5 mmol/L (ref 3.5–5.2)
Sodium: 142 mmol/L (ref 134–144)
Total Protein: 6.5 g/dL (ref 6.0–8.5)
eGFR: 65 mL/min/{1.73_m2} (ref >59)

## 2021-06-29 NOTE — Progress Notes (Signed)
Glucose 104, kidney tests normal today, with eGFR 65, calcium still high 10.9, Liver tests normal, Cholesterol high triglycerides 321, CBC ok,  ?lp

## 2021-08-09 ENCOUNTER — Ambulatory Visit: Payer: 59 | Admitting: Cardiology

## 2021-08-15 ENCOUNTER — Other Ambulatory Visit: Payer: Self-pay | Admitting: Legal Medicine

## 2021-08-15 DIAGNOSIS — F5102 Adjustment insomnia: Secondary | ICD-10-CM

## 2021-09-13 ENCOUNTER — Other Ambulatory Visit: Payer: Self-pay | Admitting: Legal Medicine

## 2021-09-13 ENCOUNTER — Ambulatory Visit: Payer: 59 | Admitting: Cardiology

## 2021-09-13 DIAGNOSIS — I10 Essential (primary) hypertension: Secondary | ICD-10-CM

## 2021-10-18 LAB — LIPID PANEL
Chol/HDL Ratio: 2.9 ratio (ref 0.0–5.0)
Cholesterol, Total: 110 mg/dL (ref 100–199)
HDL: 38 mg/dL — ABNORMAL LOW (ref >39)
LDL Chol Calc (NIH): 47 mg/dL (ref 0–99)
Triglycerides: 144 mg/dL (ref 0–149)
VLDL Cholesterol Cal: 25 mg/dL (ref 5–40)

## 2021-10-18 NOTE — Progress Notes (Signed)
Cholesterol panel good lp

## 2021-10-26 NOTE — Progress Notes (Unsigned)
Cardiology Office Note   Date:  10/27/2021   ID:  CORRIN Mckinney, DOB [birth year], MRN [id number]  PCP:  Lillard Anes, MD  Cardiologist:   Minus Breeding, MD Referring:  Lillard Anes,*   Chief Complaint  Patient presents with   Coronary Artery Disease    History of Present Illness: Robert Mckinney is a 66 y.o. male who presents for evaluation of CAD.  He was noted to have coronary calcium on a recent CT.  I sent him for a POET (Plain Old Exercise Treadmill) which was equivocal.  Coronary CT demonstrated LAD proximal stenosis that was said to be 70 to 99%, intermediate 25 to 49% stenosis, D2 50 to 69% stenosis and mild plaque elsewhere.  His calcium score was 937.  However, his FFR was positive only in the distal vessel.  He had gone greater than 9 minutes on the treadmill without ST changes.  He had no symptoms.  We had a long conversation it was decided to manage him medically with optimal medical therapy.  I did review his films with some of my partners.  Since last seeing him he has been doing okay.  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  He does about 7-10,000 steps a day and that job.  He actually played pickle ball recently for 2 hours. The patient denies any new symptoms such as chest discomfort, neck or arm discomfort. There has been no new shortness of breath, PND or orthopnea. There have been no reported palpitations, presyncope or syncope.    Past Medical History:  Diagnosis Date   Adjustment insomnia    Benign essential hypertension    Mixed hyperlipidemia    Obstructive sleep apnea (adult) (pediatric)    Other testicular dysfunction     Past Surgical History:  Procedure Laterality Date   APPENDECTOMY     KNEE SURGERY Bilateral    TONSILLECTOMY       Current Outpatient Medications  Medication Sig Dispense Refill   aspirin EC 81 MG tablet Take 1 tablet (81 mg total) by mouth daily. Swallow whole. 30 tablet 11   losartan (COZAAR)  50 MG tablet Take 1 tablet (50 mg total) by mouth daily. 90 tablet 2   metoprolol succinate (TOPROL-XL) 50 MG 24 hr tablet Take 1 tablet (50 mg total) by mouth daily. Take with or immediately following a meal. 90 tablet 3   sildenafil (VIAGRA) 100 MG tablet Take 0.5-1 tablets (50-100 mg total) by mouth daily as needed for erectile dysfunction. 10 tablet 6   traZODone (DESYREL) 150 MG tablet TAKE ONE TABLET BY MOUTH AT BEDTIME 180 tablet 0   atorvastatin (LIPITOR) 80 MG tablet Take 1 tablet (80 mg total) by mouth daily. 90 tablet 3   No current facility-administered medications for this visit.    Allergies:   Rosuvastatin calcium    ROS:  Please see the history of present illness.   Otherwise, review of systems are positive for none.   All other systems are reviewed and negative.    PHYSICAL EXAM: VS:  BP 138/70 (BP Location: Left Arm, Patient Position: Sitting, Cuff Size: Normal)   Pulse (!) 57   Ht '5\' 9"'$  (1.753 m)   Wt 236 lb (107 kg)   BMI 34.85 kg/m  , BMI Body mass index is 34.85 kg/m. GENERAL:  Well appearing NECK:  No jugular venous distention, waveform within normal limits, carotid upstroke brisk and symmetric, right carotid bruits, no  thyromegaly LUNGS:  Clear to auscultation bilaterally CHEST:  Unremarkable HEART:  PMI not displaced or sustained,S1 and S2 within normal limits, no S3, no S4, no clicks, no rubs, no murmurs ABD:  Flat, positive bowel sounds normal in frequency in pitch, no bruits, no rebound, no guarding, no midline pulsatile mass, no hepatomegaly, no splenomegaly EXT:  2 plus pulses throughout, no edema, no cyanosis no clubbing    EKG:  EKG is not ordered today. NA  Recent Labs: 06/28/2021: ALT 25; BUN 13; Creatinine, Ser 1.23; Hemoglobin 14.0; Platelets 148; Potassium 4.5; Sodium 142    Lipid Panel    Component Value Date/Time   CHOL 110 10/17/2021 1531   TRIG 144 10/17/2021 1531   HDL 38 (L) 10/17/2021 1531   CHOLHDL 2.9 10/17/2021 1531    LDLCALC 47 10/17/2021 1531   LDLDIRECT 84 04/21/2019 0956      Wt Readings from Last 3 Encounters:  10/27/21 236 lb (107 kg)  06/28/21 239 lb (108.4 kg)  05/11/21 233 lb (105.7 kg)      Other studies Reviewed: Additional studies/ records that were reviewed today include: Labs  Review of the above records demonstrates:  Please see elsewhere in the note.     ASSESSMENT AND PLAN:  CAD   Again we had a long discussion about the management of asymptomatic coronary artery disease.  He certainly is asymptomatic.  I am going to go ahead and do another POET (Plain Old Exercise Treadmill) in January to make sure he does not have any high risk findings.  Otherwise we are pursuing aggressive risk reduction.  Toward that end the 1 thing I think he is not doing is adequate diet.  We talked again about plant-based diet.    DYSLIPIDEMIA: LDL goal is 50.  He is at target.  His LDL was 47 with an HDL of 38.   HTN:    BP is above target.  I am going to increase his Cozaar to 100 mg daily.  No change in therapy.  BRUIT: I will order carotid Dopplers  OBESTIY:   We talked again about weight goals.   Current medicines are reviewed at length with the patient today.  The patient does not have concerns regarding medicines.  The following changes have been made:    Labs/ tests ordered today include: None   Orders Placed This Encounter  Procedures   EXERCISE TOLERANCE TEST (ETT)   VAS US CAROTID    Disposition:   FU with me in Jan.   Signed, Minus Breeding, MD  10/27/2021 4:03 PM    [place]

## 2021-10-27 ENCOUNTER — Encounter: Payer: Self-pay | Admitting: Cardiology

## 2021-10-27 ENCOUNTER — Ambulatory Visit (INDEPENDENT_AMBULATORY_CARE_PROVIDER_SITE_OTHER): Payer: PPO | Admitting: Cardiology

## 2021-10-27 VITALS — BP 138/70 | HR 57 | Ht 69.0 in | Wt 236.0 lb

## 2021-10-27 DIAGNOSIS — I10 Essential (primary) hypertension: Secondary | ICD-10-CM | POA: Diagnosis not present

## 2021-10-27 DIAGNOSIS — I2584 Coronary atherosclerosis due to calcified coronary lesion: Secondary | ICD-10-CM

## 2021-10-27 DIAGNOSIS — I2510 Atherosclerotic heart disease of native coronary artery without angina pectoris: Secondary | ICD-10-CM | POA: Diagnosis not present

## 2021-10-27 DIAGNOSIS — E785 Hyperlipidemia, unspecified: Secondary | ICD-10-CM

## 2021-10-27 MED ORDER — LOSARTAN POTASSIUM 100 MG PO TABS: 100.0000 mg | ORAL_TABLET | Freq: Every day | ORAL | 3 refills | Status: DC

## 2021-10-27 NOTE — Addendum Note (Signed)
Addended by: Jacqulynn Cadet on: 10/27/2021 04:08 PM   Modules accepted: Orders

## 2021-10-27 NOTE — Patient Instructions (Addendum)
Medication Instructions:  INCREASE Losartan to 100 mg daily  *If you need a refill on your cardiac medications before your next appointment, please call your pharmacy*  Lab Work: NONE ordered at this time of appointment   If you have labs (blood work) drawn today and your tests are completely normal, you will receive your results only by: [place] (if you have MyChart) OR A paper copy in the mail If you have any lab test that is abnormal or we need to change your treatment, we will call you to review the results.  Testing/Procedures: Your physician has requested that you have a carotid duplex. This test is an ultrasound of the carotid arteries in your neck. It looks at blood flow through these arteries that supply the brain with blood. Allow one hour for this exam. There are no restrictions or special instructions.  Your physician has requested that you have an exercise tolerance test.  Please schedule for January 2024  Follow-Up: At [facility], you and your health needs are our priority.  As part of our continuing mission to provide you with exceptional heart care, we have created designated Provider Care Teams.  These Care Teams include your primary Cardiologist (physician) and Advanced Practice Providers (APPs -  Physician Assistants and Nurse Practitioners) who all work together to provide you with the care you need, when you need it.  Your next appointment:   5 month(s)  The format for your next appointment:   In Person  Provider:   Minus Breeding, MD    Other Instructions   Important Information About Sugar

## 2021-10-28 ENCOUNTER — Other Ambulatory Visit: Payer: Self-pay | Admitting: Cardiology

## 2021-10-28 DIAGNOSIS — R0989 Other specified symptoms and signs involving the circulatory and respiratory systems: Secondary | ICD-10-CM

## 2021-11-01 ENCOUNTER — Ambulatory Visit (HOSPITAL_COMMUNITY)
Admission: RE | Admit: 2021-11-01 | Discharge: 2021-11-01 | Disposition: A | Discharge status: Home / Self Care | Payer: PPO | Source: Ambulatory Visit | Attending: Cardiovascular Disease | Admitting: Cardiovascular Disease

## 2021-11-01 DIAGNOSIS — R0989 Other specified symptoms and signs involving the circulatory and respiratory systems: Secondary | ICD-10-CM | POA: Diagnosis not present

## 2021-11-03 ENCOUNTER — Encounter: Payer: PPO | Admitting: Family Medicine

## 2021-11-03 ENCOUNTER — Encounter: Payer: Self-pay | Admitting: Internal Medicine

## 2021-11-08 ENCOUNTER — Encounter: Payer: Self-pay | Admitting: *Deleted

## 2021-11-09 ENCOUNTER — Encounter: Payer: Self-pay | Admitting: Family Medicine

## 2021-11-09 ENCOUNTER — Ambulatory Visit (INDEPENDENT_AMBULATORY_CARE_PROVIDER_SITE_OTHER): Payer: PPO | Admitting: Family Medicine

## 2021-11-09 VITALS — BP 108/62 | HR 87 | Temp 97.8°F | Ht 68.0 in | Wt 237.0 lb

## 2021-11-09 DIAGNOSIS — Z1211 Encounter for screening for malignant neoplasm of colon: Secondary | ICD-10-CM

## 2021-11-09 DIAGNOSIS — Z6835 Body mass index (BMI) 35.0-35.9, adult: Secondary | ICD-10-CM | POA: Diagnosis not present

## 2021-11-09 DIAGNOSIS — Z23 Encounter for immunization: Secondary | ICD-10-CM | POA: Diagnosis not present

## 2021-11-09 DIAGNOSIS — Z0000 Encounter for general adult medical examination without abnormal findings: Secondary | ICD-10-CM | POA: Diagnosis not present

## 2021-11-09 NOTE — Progress Notes (Signed)
Subjective:   Robert Mckinney is a 66 y.o. male who presents for a Welcome to Medicare exam. Patient has no complaints today. He has had recent cardiac work up and labs with Dr. Percival Spanish.   Review of Systems  Constitutional:  Negative for fever and malaise/fatigue.  HENT:  Negative for congestion, ear pain and sore throat.   Respiratory:  Negative for cough and shortness of breath.   Cardiovascular:  Negative for chest pain and palpitations.  Gastrointestinal:  Negative for abdominal pain, diarrhea, nausea and vomiting.  Genitourinary:  Negative for frequency and urgency.  Neurological:  Negative for headaches.    Cardiac Risk Factors include: none;advanced age (>58mn, >>36women)     Objective:    Today's Vitals   11/09/21 1339  BP: 108/62  Pulse: 87  Temp: 97.8 F (36.6 C)  TempSrc: Temporal  SpO2: 96%  Weight: 237 lb (107.5 kg)   Body mass index is 35 kg/m. Physical Exam Vitals reviewed.  Constitutional:      Appearance: Normal appearance.  HENT:     Right Ear: Tympanic membrane, ear canal and external ear normal.     Left Ear: Tympanic membrane, ear canal and external ear normal.     Nose: Nose normal. No congestion or rhinorrhea.     Mouth/Throat:     Mouth: Mucous membranes are moist.     Pharynx: No oropharyngeal exudate or posterior oropharyngeal erythema.  Neck:     Vascular: No carotid bruit.  Cardiovascular:     Rate and Rhythm: Normal rate and regular rhythm.     Pulses: Normal pulses.     Heart sounds: Normal heart sounds.  Pulmonary:     Effort: Pulmonary effort is normal. No respiratory distress.     Breath sounds: Normal breath sounds. No wheezing, rhonchi or rales.  Abdominal:     General: Bowel sounds are normal.     Palpations: Abdomen is soft.     Tenderness: There is no abdominal tenderness.  Lymphadenopathy:     Cervical: No cervical adenopathy.  Neurological:     Mental Status: He is alert.  Psychiatric:        Mood and Affect: Mood  normal.        Behavior: Behavior normal.     Medications Outpatient Encounter Medications as of 11/09/2021  Medication Sig   aspirin EC 81 MG tablet Take 1 tablet (81 mg total) by mouth daily. Swallow whole.   losartan (COZAAR) 100 MG tablet Take 1 tablet (100 mg total) by mouth daily.   metoprolol succinate (TOPROL-XL) 50 MG 24 hr tablet Take 1 tablet (50 mg total) by mouth daily. Take with or immediately following a meal.   sildenafil (VIAGRA) 100 MG tablet Take 0.5-1 tablets (50-100 mg total) by mouth daily as needed for erectile dysfunction.   traZODone (DESYREL) 150 MG tablet TAKE ONE TABLET BY MOUTH AT BEDTIME   [DISCONTINUED] atorvastatin (LIPITOR) 80 MG tablet Take 1 tablet (80 mg total) by mouth daily.   No facility-administered encounter medications on file as of 11/09/2021.     History: Past Medical History:  Diagnosis Date   Adjustment insomnia    Benign essential hypertension    Mixed hyperlipidemia    Obstructive sleep apnea (adult) (pediatric)    Other testicular dysfunction    Past Surgical History:  Procedure Laterality Date   APPENDECTOMY     KNEE SURGERY Bilateral    TONSILLECTOMY      History reviewed. No pertinent family  history. Social History   Occupational History   Occupation: Sales  Tobacco Use   Smoking status: Never   Smokeless tobacco: Never  Substance and Sexual Activity   Alcohol use: Yes    Alcohol/week: 1.0 standard drink of alcohol    Types: 1 Cans of beer per week    Comment: Once a week   Drug use: No   Sexual activity: Yes    Partners: Female   Tobacco Counseling Counseling given: Not Answered   Immunizations and Health Maintenance Immunization History  Administered Date(s) Administered   Fluad Quad(high Dose 65+) 12/12/2018, 11/24/2020   Influenza Inj Mdck Quad Pf 11/13/2019   Moderna Sars-Covid-2 Vaccination 06/23/2020   PFIZER(Purple Top)SARS-COV-2 Vaccination 04/14/2019, 05/09/2019, 12/04/2019   Tdap 11/27/2019    Zoster Recombinat (Shingrix) 11/27/2019, 01/27/2020   Health Maintenance Due  Topic Date Due   Hepatitis C Screening  Never done   COLONOSCOPY (Pts 45-35yr Insurance coverage will need to be confirmed)  Never done   COVID-19 Vaccine (5 - Pfizer series) 08/18/2020   Pneumonia Vaccine 66 Years old (1 - PCV) Never done   INFLUENZA VACCINE  10/04/2021    Activities of Daily Living    11/09/2021    1:49 PM  In your present state of health, do you have any difficulty performing the following activities:  Hearing? 0  Vision? 0  Difficulty concentrating or making decisions? 0  Walking or climbing stairs? 0  Dressing or bathing? 0  Doing errands, shopping? 0  Preparing Food and eating ? N  Using the Toilet? N  In the past six months, have you accidently leaked urine? N  Do you have problems with loss of bowel control? N  Managing your Medications? N  Managing your Finances? N  Housekeeping or managing your Housekeeping? N    Advanced Directives: Does Patient Have a Medical Advance Directive?: No Would patient like information on creating a medical advance directive?: Yes (Inpatient - patient defers creating a medical advance directive at this time - Information given)    Assessment:    This is a routine wellness  examination for this patient .   Vision/Hearing screen No results found.  Dietary issues and exercise activities discussed:  Current Exercise Habits: Home exercise routine, Type of exercise: walking, Time (Minutes): 30, Frequency (Times/Week): 7, Weekly Exercise (Minutes/Week): 210, Intensity: Mild, Exercise limited by: None identified    Depression Screen    11/09/2021    1:43 PM 09/20/2020    7:57 AM 06/04/2019    8:17 AM  PHQ 2/9 Scores  PHQ - 2 Score 0 0 0     Fall Risk    11/09/2021    1:46 PM  FGagein the past year? 0  Number falls in past yr: 0  Injury with Fall? 0  Risk for fall due to : History of fall(s)  Follow up Falls evaluation  completed;Falls prevention discussed    Cognitive Function  No concerns      Patient Care Team: CRochel Brome MD as PCP - General (Internal Medicine) HMinus Breeding MD as PCP - Cardiology (Cardiology)     Plan:   Problem List Items Addressed This Visit       Other   Encounter for Medicare annual wellness exam - Primary    Recommend continue to work on eating healthy diet and exercise. Establish advance directives.       Relevant Orders   Cologuard   Screening for colon cancer  cologuard ordered.       Relevant Orders   Cologuard   Need for pneumococcal 20-valent conjugate vaccination   Relevant Orders   Pneumococcal conjugate vaccine 20-valent (Prevnar 20)   Class 2 severe obesity due to excess calories with serious comorbidity and body mass index (BMI) of 35.0 to 35.9 in adult [facility])    comorbidities include hypertension and hyperlipidemia.  Patient is doing well. Recommend continue to work on eating healthy diet and exercise.         I have personally reviewed and noted the following in the patient's chart:   Medical and social history Use of alcohol, tobacco or illicit drugs  Current medications and supplements Functional ability and status Nutritional status Physical activity Advanced directives List of other physicians Hospitalizations, surgeries, and ER visits in previous 12 months Vitals Screenings to include cognitive, depression, and falls Referrals and appointments  In addition, I have reviewed and discussed with patient certain preventive protocols, quality metrics, and best practice recommendations. A written personalized care plan for preventive services as well as general preventive health recommendations were provided to patient.

## 2021-11-09 NOTE — Patient Instructions (Addendum)
  Mr. Robert Mckinney , Thank you for taking time to come for your Medicare Wellness Visit. I appreciate your ongoing commitment to your health goals. Please review the following plan we discussed and let me know if I can assist you in the future.   These are the goals we discussed: Recommend continue to work on eating healthy diet and exercise. Establish Will and Health care power of attorney.    This is a list of the screening recommended for you and due dates:  Health Maintenance  Topic Date Due   Hepatitis C Screening: USPSTF Recommendation to screen - Ages 52-79 yo.  Never done   Colon Cancer Screening  Never done   COVID-19 Vaccine (5 - Pfizer series) 08/18/2020   Pneumonia Vaccine (1 - PCV) Never done   Flu Shot  10/04/2021   Tetanus Vaccine  11/26/2029   Zoster (Shingles) Vaccine  Completed   HPV Vaccine  Aged Out   Patient given information on Advance directive Cologuard ordered.  [name], MD

## 2021-11-09 NOTE — Assessment & Plan Note (Signed)
comorbidities include hypertension and hyperlipidemia.  Patient is doing well. Recommend continue to work on eating healthy diet and exercise.

## 2021-11-09 NOTE — Assessment & Plan Note (Signed)
cologuard ordered.

## 2021-11-09 NOTE — Assessment & Plan Note (Signed)
Recommend continue to work on eating healthy diet and exercise. Establish advance directives.

## 2021-11-22 NOTE — Telephone Encounter (Signed)
MR, please see pts message regarding his cardiac CT scans he had in 04/2021. Pt originally questioned if you could see what was needed from the cardiac CT scans. Then latest message from pt states Dr. Percival Spanish wants pt to wait to have the HRCT in January or February; HRCT is currently scheduled for 01/19/2022. Please advise. Thanks.

## 2021-11-23 NOTE — Telephone Encounter (Signed)
Yes cancel Nov 2023 HRCT. This is because the nodules are not showing up on cardiac CT though this is a less sensitive approach but fact is not showing up means nothing big going on right now.  AGree waiting on HRCT possibly een through summer 2024. Ensure followup

## 2021-11-24 ENCOUNTER — Other Ambulatory Visit: Payer: Self-pay

## 2021-11-24 DIAGNOSIS — R918 Other nonspecific abnormal finding of lung field: Secondary | ICD-10-CM

## 2021-11-30 DIAGNOSIS — L703 Acne tropica: Secondary | ICD-10-CM | POA: Diagnosis not present

## 2021-11-30 DIAGNOSIS — L570 Actinic keratosis: Secondary | ICD-10-CM | POA: Diagnosis not present

## 2021-11-30 DIAGNOSIS — L821 Other seborrheic keratosis: Secondary | ICD-10-CM | POA: Diagnosis not present

## 2021-11-30 DIAGNOSIS — B351 Tinea unguium: Secondary | ICD-10-CM | POA: Diagnosis not present

## 2021-12-12 ENCOUNTER — Encounter: Payer: Self-pay | Admitting: Cardiology

## 2021-12-16 DIAGNOSIS — Z1211 Encounter for screening for malignant neoplasm of colon: Secondary | ICD-10-CM | POA: Diagnosis not present

## 2021-12-22 LAB — COLOGUARD: COLOGUARD: NEGATIVE

## 2022-01-12 DIAGNOSIS — H25813 Combined forms of age-related cataract, bilateral: Secondary | ICD-10-CM | POA: Diagnosis not present

## 2022-01-12 DIAGNOSIS — H524 Presbyopia: Secondary | ICD-10-CM | POA: Diagnosis not present

## 2022-01-19 ENCOUNTER — Ambulatory Visit (HOSPITAL_BASED_OUTPATIENT_CLINIC_OR_DEPARTMENT_OTHER): Payer: PPO

## 2022-02-07 ENCOUNTER — Ambulatory Visit: Payer: PPO | Admitting: Internal Medicine

## 2022-02-08 ENCOUNTER — Encounter: Payer: Self-pay | Admitting: Cardiology

## 2022-02-16 ENCOUNTER — Other Ambulatory Visit: Payer: Self-pay | Admitting: Family Medicine

## 2022-02-16 DIAGNOSIS — F5102 Adjustment insomnia: Secondary | ICD-10-CM

## 2022-02-21 ENCOUNTER — Ambulatory Visit: Payer: PPO

## 2022-02-21 VITALS — BP 111/65 | HR 65

## 2022-02-21 DIAGNOSIS — I10 Essential (primary) hypertension: Secondary | ICD-10-CM

## 2022-02-21 NOTE — Progress Notes (Signed)
Patient came today to verified if his blood pressure machine is working well. I took his blood pressure and it was 116/60 pulse 65. Then I took his blood pressure with his blood pressure machine and it was 111/65 pulse 69. There was not big difference between both readings.

## 2022-03-01 ENCOUNTER — Telehealth (HOSPITAL_COMMUNITY): Payer: Self-pay | Admitting: *Deleted

## 2022-03-01 NOTE — Telephone Encounter (Signed)
Instructions given for upcoming ETT.  Robert Mckinney

## 2022-03-08 ENCOUNTER — Ambulatory Visit (HOSPITAL_COMMUNITY): Payer: PPO | Attending: Cardiology

## 2022-03-08 ENCOUNTER — Encounter (HOSPITAL_COMMUNITY): Payer: PPO

## 2022-03-08 DIAGNOSIS — I2510 Atherosclerotic heart disease of native coronary artery without angina pectoris: Secondary | ICD-10-CM | POA: Insufficient documentation

## 2022-03-08 DIAGNOSIS — I2584 Coronary atherosclerosis due to calcified coronary lesion: Secondary | ICD-10-CM | POA: Insufficient documentation

## 2022-03-08 LAB — EXERCISE TOLERANCE TEST
Angina Index: 0
Duke Treadmill Score: -2
Estimated workload: 10.1
Exercise duration (min): 8 min
Exercise duration (sec): 1 s
MPHR: 154 {beats}/min
Peak HR: 137 {beats}/min
Percent HR: 88 %
RPE: 17
Rest HR: 56 {beats}/min
ST Depression (mm): 2 mm

## 2022-03-11 DIAGNOSIS — R0989 Other specified symptoms and signs involving the circulatory and respiratory systems: Secondary | ICD-10-CM | POA: Insufficient documentation

## 2022-03-11 NOTE — H&P (View-Only) (Signed)
Cardiology Office Note   Date:  03/13/2022   ID:  [name], DOB 12/18/1955, MRN [id number]  PCP:  Robert Brome, MD  Cardiologist:   Minus Breeding, MD Referring:  Lillard Anes,*   Chief Complaint  Patient presents with   Coronary Artery Disease    History of Present Illness: Robert Mckinney is a 67 y.o. male who presents for evaluation of CAD.  He was noted to have coronary calcium on a recent CT.  I sent him for a POET (Plain Old Exercise Treadmill) which was equivocal.  Coronary CT demonstrated LAD proximal stenosis that was said to be 70 to 99%, intermediate 25 to 49% stenosis, D2 50 to 69% stenosis and mild plaque elsewhere.  His calcium score was 937.  However, his FFR was positive only in the distal vessel.  He had gone greater than 9 minutes on the treadmill without ST changes.  He had no symptoms.  We had a long conversation it was decided to manage him medically with optimal medical therapy.  I did review his films with some of my partners.  Since last seeing him he has not really changed his lifestyle.  He has not eaten very well.  He is not exercising as much as I would like.  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  He is not having any new chest pressure, neck or arm discomfort.  However, I sent him for a stress test earlier this month comparing it to last year.  He had inferior ST segment changes at about 7 minutes.  This was worse slightly than the test from last year.  It was an intermediate risk result.   Past Medical History:  Diagnosis Date   Adjustment insomnia    Benign essential hypertension    Mixed hyperlipidemia    Obstructive sleep apnea (adult) (pediatric)    Other testicular dysfunction     Past Surgical History:  Procedure Laterality Date   APPENDECTOMY     KNEE SURGERY Bilateral    TONSILLECTOMY       Current Outpatient Medications  Medication Sig Dispense Refill   aspirin EC 81 MG tablet Take 1 tablet (81 mg total)  by mouth daily. Swallow whole. 30 tablet 11   losartan (COZAAR) 100 MG tablet Take 1 tablet (100 mg total) by mouth daily. 90 tablet 3   metoprolol succinate (TOPROL-XL) 50 MG 24 hr tablet Take 1 tablet (50 mg total) by mouth daily. Take with or immediately following a meal. 90 tablet 3   sildenafil (VIAGRA) 100 MG tablet Take 0.5-1 tablets (50-100 mg total) by mouth daily as needed for erectile dysfunction. 10 tablet 6   traZODone (DESYREL) 150 MG tablet TAKE ONE TABLET BY MOUTH AT BEDTIME 180 tablet 0   Current Facility-Administered Medications  Medication Dose Route Frequency Provider Last Rate Last Admin   sodium chloride flush (NS) 0.9 % injection 3 mL  3 mL Intravenous Q12H Minus Breeding, MD        Allergies:   Rosuvastatin calcium    ROS:  Please see the history of present illness.   Otherwise, review of systems are positive for none.   All other systems are reviewed and negative.    PHYSICAL EXAM: VS:  BP 124/60   Pulse (!) 52   Ht '5\' 9"'$  (1.753 m)   Wt 242 lb 6.4 oz (110 kg)   SpO2 98%   BMI 35.80 kg/m  ,  BMI Body mass index is 35.8 kg/m. GENERAL:  Well appearing NECK:  No jugular venous distention, waveform within normal limits, carotid upstroke brisk and symmetric, no bruits, no thyromegaly LUNGS:  Clear to auscultation bilaterally CHEST:  Unremarkable HEART:  PMI not displaced or sustained,S1 and S2 within normal limits, no S3, no S4, no clicks, no rubs, no murmurs ABD:  Flat, positive bowel sounds normal in frequency in pitch, no bruits, no rebound, no guarding, no midline pulsatile mass, no hepatomegaly, no splenomegaly EXT:  2 plus pulses throughout, no edema, no cyanosis no clubbing  EKG:  EKG is  ordered today. Sinus bradycardia, rate 52, axis within normal limits, intervals within normal limits, no acute ST-T wave changes.  Recent Labs: 06/28/2021: ALT 25; BUN 13; Creatinine, Ser 1.23; Hemoglobin 14.0; Platelets 148; Potassium 4.5; Sodium 142    Lipid  Panel    Component Value Date/Time   CHOL 110 10/17/2021 1531   TRIG 144 10/17/2021 1531   HDL 38 (L) 10/17/2021 1531   CHOLHDL 2.9 10/17/2021 1531   LDLCALC 47 10/17/2021 1531   LDLDIRECT 84 04/21/2019 0956      Wt Readings from Last 3 Encounters:  03/13/22 242 lb 6.4 oz (110 kg)  11/09/21 237 lb (107.5 kg)  10/27/21 236 lb (107 kg)      Other studies Reviewed: Additional studies/ records that were reviewed today include: POET (Plain Old Exercise Treadmill)  Review of the above records demonstrates:  Please see elsewhere in the note.     ASSESSMENT AND PLAN:  CAD   the patient has disease as above and positive changes on his treadmill that have progressed over the year.  He has not changed his lifestyle.  He really would not be somebody fitting into clinical trial results as he has not been as aggressive with lifestyle as those trials with mandate.  He is not low risk.  Cardiac catheterization is indicated.  I had a very long conversation with the patient and his wife about this and they would agree. The patient understands that risks included but are not limited to stroke (1 in 1000), death (1 in 68), kidney failure [usually temporary] (1 in 500), bleeding (1 in 200), allergic reaction [possibly serious] (1 in 200).  The patient understands and agrees to proceed.   DYSLIPIDEMIA: LDL goal is 47.  He will continue his statin.   HTN:    BP is at target.  No change in therapy.   OBESTIY:    We again had an extensive conversation about diet.   Current medicines are reviewed at length with the patient today.  The patient does not have concerns regarding medicines.  The following changes have been made:   As above  Labs/ tests ordered today include:    Orders Placed This Encounter  Procedures   Basic Metabolic Panel (BMET)   CBC   EKG 12-Lead    Disposition:   FU with me after the cath.    Signed, Minus Breeding, MD  03/13/2022 12:13 PM    [facility]

## 2022-03-11 NOTE — Progress Notes (Unsigned)
Cardiology Office Note   Date:  03/13/2022   ID:  Robert Mckinney, DOB 09-Aug-1955, MRN [id number]  PCP:  [name], MD  Cardiologist:   Minus Breeding, MD Referring:  Lillard Anes,*   Chief Complaint  Patient presents with   Coronary Artery Disease    History of Present Illness: Robert Mckinney is a 67 y.o. male who presents for evaluation of CAD.  He was noted to have coronary calcium on a recent CT.  I sent him for a POET (Plain Old Exercise Treadmill) which was equivocal.  Coronary CT demonstrated LAD proximal stenosis that was said to be 70 to 99%, intermediate 25 to 49% stenosis, D2 50 to 69% stenosis and mild plaque elsewhere.  His calcium score was 937.  However, his FFR was positive only in the distal vessel.  He had gone greater than 9 minutes on the treadmill without ST changes.  He had no symptoms.  We had a long conversation it was decided to manage him medically with optimal medical therapy.  I did review his films with some of my partners.  Since last seeing him he has not really changed his lifestyle.  He has not eaten very well.  He is not exercising as much as I would like.  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  He is not having any new chest pressure, neck or arm discomfort.  However, I sent him for a stress test earlier this month comparing it to last year.  He had inferior ST segment changes at about 7 minutes.  This was worse slightly than the test from last year.  It was an intermediate risk result.   Past Medical History:  Diagnosis Date   Adjustment insomnia    Benign essential hypertension    Mixed hyperlipidemia    Obstructive sleep apnea (adult) (pediatric)    Other testicular dysfunction     Past Surgical History:  Procedure Laterality Date   APPENDECTOMY     KNEE SURGERY Bilateral    TONSILLECTOMY       Current Outpatient Medications  Medication Sig Dispense Refill   aspirin EC 81 MG tablet Take 1 tablet (81 mg total)  by mouth daily. Swallow whole. 30 tablet 11   losartan (COZAAR) 100 MG tablet Take 1 tablet (100 mg total) by mouth daily. 90 tablet 3   metoprolol succinate (TOPROL-XL) 50 MG 24 hr tablet Take 1 tablet (50 mg total) by mouth daily. Take with or immediately following a meal. 90 tablet 3   sildenafil (VIAGRA) 100 MG tablet Take 0.5-1 tablets (50-100 mg total) by mouth daily as needed for erectile dysfunction. 10 tablet 6   traZODone (DESYREL) 150 MG tablet TAKE ONE TABLET BY MOUTH AT BEDTIME 180 tablet 0   Current Facility-Administered Medications  Medication Dose Route Frequency Provider Last Rate Last Admin   sodium chloride flush (NS) 0.9 % injection 3 mL  3 mL Intravenous Q12H Minus Breeding, MD        Allergies:   Rosuvastatin calcium    ROS:  Please see the history of present illness.   Otherwise, review of systems are positive for none.   All other systems are reviewed and negative.    PHYSICAL EXAM: VS:  BP 124/60   Pulse (!) 52   Ht '5\' 9"'$  (1.753 m)   Wt 242 lb 6.4 oz (110 kg)   SpO2 98%   BMI 35.80 kg/m  ,  BMI Body mass index is 35.8 kg/m. GENERAL:  Well appearing NECK:  No jugular venous distention, waveform within normal limits, carotid upstroke brisk and symmetric, no bruits, no thyromegaly LUNGS:  Clear to auscultation bilaterally CHEST:  Unremarkable HEART:  PMI not displaced or sustained,S1 and S2 within normal limits, no S3, no S4, no clicks, no rubs, no murmurs ABD:  Flat, positive bowel sounds normal in frequency in pitch, no bruits, no rebound, no guarding, no midline pulsatile mass, no hepatomegaly, no splenomegaly EXT:  2 plus pulses throughout, no edema, no cyanosis no clubbing  EKG:  EKG is  ordered today. Sinus bradycardia, rate 52, axis within normal limits, intervals within normal limits, no acute ST-T wave changes.  Recent Labs: 06/28/2021: ALT 25; BUN 13; Creatinine, Ser 1.23; Hemoglobin 14.0; Platelets 148; Potassium 4.5; Sodium 142    Lipid  Panel    Component Value Date/Time   CHOL 110 10/17/2021 1531   TRIG 144 10/17/2021 1531   HDL 38 (L) 10/17/2021 1531   CHOLHDL 2.9 10/17/2021 1531   LDLCALC 47 10/17/2021 1531   LDLDIRECT 84 04/21/2019 0956      Wt Readings from Last 3 Encounters:  03/13/22 242 lb 6.4 oz (110 kg)  11/09/21 237 lb (107.5 kg)  10/27/21 236 lb (107 kg)      Other studies Reviewed: Additional studies/ records that were reviewed today include: POET (Plain Old Exercise Treadmill)  Review of the above records demonstrates:  Please see elsewhere in the note.     ASSESSMENT AND PLAN:  CAD   the patient has disease as above and positive changes on his treadmill that have progressed over the year.  He has not changed his lifestyle.  He really would not be somebody fitting into clinical trial results as he has not been as aggressive with lifestyle as those trials with mandate.  He is not low risk.  Cardiac catheterization is indicated.  I had a very long conversation with the patient and his wife about this and they would agree. The patient understands that risks included but are not limited to stroke (1 in 1000), death (1 in 38), kidney failure [usually temporary] (1 in 500), bleeding (1 in 200), allergic reaction [possibly serious] (1 in 200).  The patient understands and agrees to proceed.   DYSLIPIDEMIA: LDL goal is 47.  He will continue his statin.   HTN:    BP is at target.  No change in therapy.   OBESTIY:    We again had an extensive conversation about diet.   Current medicines are reviewed at length with the patient today.  The patient does not have concerns regarding medicines.  The following changes have been made:   As above  Labs/ tests ordered today include:    Orders Placed This Encounter  Procedures   Basic Metabolic Panel (BMET)   CBC   EKG 12-Lead    Disposition:   FU with me after the cath.    Signed, Minus Breeding, MD  03/13/2022 12:13 PM    [facility]

## 2022-03-13 ENCOUNTER — Ambulatory Visit: Payer: PPO | Attending: Cardiology | Admitting: Cardiology

## 2022-03-13 ENCOUNTER — Encounter: Payer: Self-pay | Admitting: Cardiology

## 2022-03-13 VITALS — BP 124/60 | HR 52 | Ht 69.0 in | Wt 242.4 lb

## 2022-03-13 DIAGNOSIS — E785 Hyperlipidemia, unspecified: Secondary | ICD-10-CM | POA: Diagnosis not present

## 2022-03-13 DIAGNOSIS — R0989 Other specified symptoms and signs involving the circulatory and respiratory systems: Secondary | ICD-10-CM | POA: Diagnosis not present

## 2022-03-13 DIAGNOSIS — I2510 Atherosclerotic heart disease of native coronary artery without angina pectoris: Secondary | ICD-10-CM | POA: Diagnosis not present

## 2022-03-13 DIAGNOSIS — I10 Essential (primary) hypertension: Secondary | ICD-10-CM

## 2022-03-13 MED ORDER — SODIUM CHLORIDE 0.9% FLUSH: 3.0000 mL | Freq: Two times a day (BID) | INTRAVENOUS | Status: DC

## 2022-03-13 NOTE — Patient Instructions (Signed)
  [place] A DEPT OF [place] A DEPT OF [place]. [facility] [place] [id number] [place] [address] Dept: [phone] Loc: [phone]  Robert Mckinney  03/13/2022  You are scheduled for a Cardiac Catheterization on Monday, January 15 with Dr. Harrell Gave End.  1. Please arrive at the [facility] (Main Entrance A) at [facility]: [place],  [id number] at 5:30 AM (This time is two hours before your procedure to ensure your preparation). Free valet parking service is available.   Special note: Every effort is made to have your procedure done on time. Please understand that emergencies sometimes delay scheduled procedures.  2. Diet: Do not eat solid foods after midnight.  The patient may have clear liquids until 5am upon the day of the procedure.  3. Labs: You will need to have blood drawn on today   4. Medication instructions in preparation for your procedure:   On the morning of your procedure, take your Aspirin 81 mg and any morning medicines NOT listed above.  You may use sips of water.  5. Plan for one night stay--bring personal belongings. 6. Bring a current list of your medications and current insurance cards. 7. You MUST have a responsible person to drive you home. 8. Someone MUST be with you the first 24 hours after you arrive home or your discharge will be delayed. 9. Please wear clothes that are easy to get on and off and wear slip-on shoes.  Thank you for allowing [place] to care for you!   -- [facility]

## 2022-03-14 LAB — BASIC METABOLIC PANEL
BUN/Creatinine Ratio: 12 (ref 10–24)
BUN: 15 mg/dL (ref 8–27)
CO2: 23 mmol/L (ref 20–29)
Calcium: 10.8 mg/dL — ABNORMAL HIGH (ref 8.6–10.2)
Chloride: 107 mmol/L — ABNORMAL HIGH (ref 96–106)
Creatinine, Ser: 1.25 mg/dL (ref 0.76–1.27)
Glucose: 84 mg/dL (ref 70–99)
Potassium: 4.6 mmol/L (ref 3.5–5.2)
Sodium: 142 mmol/L (ref 134–144)
eGFR: 64 mL/min/{1.73_m2} (ref >59)

## 2022-03-14 LAB — CBC
Hematocrit: 41.5 % (ref 37.5–51.0)
Hemoglobin: 14.3 g/dL (ref 13.0–17.7)
MCH: 31.4 pg (ref 26.6–33.0)
MCHC: 34.5 g/dL (ref 31.5–35.7)
MCV: 91 fL (ref 79–97)
Platelets: 161 10*3/uL (ref 150–450)
RBC: 4.56 x10E6/uL (ref 4.14–5.80)
RDW: 13.2 % (ref 11.6–15.4)
WBC: 6 10*3/uL (ref 3.4–10.8)

## 2022-03-16 ENCOUNTER — Telehealth: Payer: Self-pay | Admitting: *Deleted

## 2022-03-16 NOTE — Telephone Encounter (Signed)
Cardiac Catheterization scheduled at [facility] for: Monday March 20, 2022 7:30 AM Arrival time and place: [place] Entrance A at: 5:30 AM  Nothing to eat after midnight prior to procedure, clear liquids until 5 AM day of procedure.  Medication instructions: -Usual morning medications can be taken with sips of water including aspirin 81 mg.  Confirmed patient has responsible adult to drive home post procedure and be with patient first 24 hours after arriving home.  Patient reports no new symptoms concerning for COVID-19 in the past 10 days.  Reviewed procedure instructions with patient.

## 2022-03-17 ENCOUNTER — Ambulatory Visit: Payer: PPO | Admitting: Internal Medicine

## 2022-03-17 ENCOUNTER — Other Ambulatory Visit: Payer: Self-pay

## 2022-03-17 DIAGNOSIS — R059 Cough, unspecified: Secondary | ICD-10-CM | POA: Diagnosis not present

## 2022-03-17 DIAGNOSIS — G4733 Obstructive sleep apnea (adult) (pediatric): Secondary | ICD-10-CM | POA: Diagnosis not present

## 2022-03-17 DIAGNOSIS — R0981 Nasal congestion: Secondary | ICD-10-CM | POA: Diagnosis not present

## 2022-03-17 DIAGNOSIS — J343 Hypertrophy of nasal turbinates: Secondary | ICD-10-CM | POA: Diagnosis not present

## 2022-03-20 ENCOUNTER — Encounter (HOSPITAL_COMMUNITY): Payer: Self-pay | Admitting: Internal Medicine

## 2022-03-20 ENCOUNTER — Ambulatory Visit (HOSPITAL_COMMUNITY)
Admission: RE | Admit: 2022-03-20 | Discharge: 2022-03-20 | Disposition: A | Discharge status: Home / Self Care | Payer: PPO | Attending: Internal Medicine | Admitting: Internal Medicine

## 2022-03-20 ENCOUNTER — Other Ambulatory Visit: Payer: Self-pay

## 2022-03-20 ENCOUNTER — Encounter (HOSPITAL_COMMUNITY)
Admission: RE | Disposition: A | Discharge status: Home / Self Care | Payer: Self-pay | Source: Home / Self Care | Attending: Internal Medicine

## 2022-03-20 DIAGNOSIS — I10 Essential (primary) hypertension: Secondary | ICD-10-CM | POA: Diagnosis not present

## 2022-03-20 DIAGNOSIS — Z79899 Other long term (current) drug therapy: Secondary | ICD-10-CM | POA: Insufficient documentation

## 2022-03-20 DIAGNOSIS — I2510 Atherosclerotic heart disease of native coronary artery without angina pectoris: Secondary | ICD-10-CM | POA: Insufficient documentation

## 2022-03-20 DIAGNOSIS — I2584 Coronary atherosclerosis due to calcified coronary lesion: Secondary | ICD-10-CM | POA: Insufficient documentation

## 2022-03-20 DIAGNOSIS — R0989 Other specified symptoms and signs involving the circulatory and respiratory systems: Secondary | ICD-10-CM

## 2022-03-20 DIAGNOSIS — R9439 Abnormal result of other cardiovascular function study: Secondary | ICD-10-CM | POA: Diagnosis not present

## 2022-03-20 DIAGNOSIS — G4733 Obstructive sleep apnea (adult) (pediatric): Secondary | ICD-10-CM | POA: Diagnosis not present

## 2022-03-20 DIAGNOSIS — E785 Hyperlipidemia, unspecified: Secondary | ICD-10-CM | POA: Diagnosis not present

## 2022-03-20 HISTORY — PX: LEFT HEART CATH AND CORONARY ANGIOGRAPHY: CATH118249

## 2022-03-20 SURGERY — LEFT HEART CATH AND CORONARY ANGIOGRAPHY
Anesthesia: LOCAL

## 2022-03-20 MED ORDER — FENTANYL CITRATE (PF) 100 MCG/2ML IJ SOLN
INTRAMUSCULAR | Status: DC | PRN
  Administered 2022-03-20: 50 ug via INTRAVENOUS

## 2022-03-20 MED ORDER — HEPARIN (PORCINE) IN NACL 1000-0.9 UT/500ML-% IV SOLN
INTRAVENOUS | Status: DC | PRN
  Administered 2022-03-20 (×2): 500 mL

## 2022-03-20 MED ORDER — ASPIRIN 81 MG PO CHEW: 81.0000 mg | CHEWABLE_TABLET | ORAL | Status: DC

## 2022-03-20 MED ORDER — FENTANYL CITRATE (PF) 100 MCG/2ML IJ SOLN
INTRAMUSCULAR | Status: AC
  Filled 2022-03-20: qty 2

## 2022-03-20 MED ORDER — LABETALOL HCL 5 MG/ML IV SOLN: 10.0000 mg | INTRAVENOUS | Status: DC | PRN

## 2022-03-20 MED ORDER — SODIUM CHLORIDE 0.9% FLUSH: 3.0000 mL | INTRAVENOUS | Status: DC | PRN

## 2022-03-20 MED ORDER — SODIUM CHLORIDE 0.9% FLUSH: 3.0000 mL | Freq: Two times a day (BID) | INTRAVENOUS | Status: DC

## 2022-03-20 MED ORDER — SODIUM CHLORIDE 0.9 % WEIGHT BASED INFUSION
3.0000 mL/kg/h | INTRAVENOUS | Status: AC
  Administered 2022-03-20: 3 mL/kg/h via INTRAVENOUS

## 2022-03-20 MED ORDER — SODIUM CHLORIDE 0.9 % WEIGHT BASED INFUSION: 1.0000 mL/kg/h | INTRAVENOUS | Status: DC

## 2022-03-20 MED ORDER — SODIUM CHLORIDE 0.9 % IV SOLN: INTRAVENOUS | Status: DC

## 2022-03-20 MED ORDER — VERAPAMIL HCL 2.5 MG/ML IV SOLN
INTRAVENOUS | Status: DC | PRN
  Administered 2022-03-20: 10 mL via INTRA_ARTERIAL

## 2022-03-20 MED ORDER — SODIUM CHLORIDE 0.9 % IV SOLN: 250.0000 mL | INTRAVENOUS | Status: DC | PRN

## 2022-03-20 MED ORDER — MIDAZOLAM HCL 2 MG/2ML IJ SOLN
INTRAMUSCULAR | Status: AC
  Filled 2022-03-20: qty 2

## 2022-03-20 MED ORDER — LIDOCAINE HCL (PF) 1 % IJ SOLN
INTRAMUSCULAR | Status: DC | PRN
  Administered 2022-03-20: 2 mL

## 2022-03-20 MED ORDER — ACETAMINOPHEN 325 MG PO TABS: 650.0000 mg | ORAL_TABLET | ORAL | Status: DC | PRN

## 2022-03-20 MED ORDER — LIDOCAINE HCL (PF) 1 % IJ SOLN
INTRAMUSCULAR | Status: AC
  Filled 2022-03-20: qty 30

## 2022-03-20 MED ORDER — MIDAZOLAM HCL 2 MG/2ML IJ SOLN
INTRAMUSCULAR | Status: DC | PRN
  Administered 2022-03-20: 1 mg via INTRAVENOUS

## 2022-03-20 MED ORDER — HEPARIN SODIUM (PORCINE) 1000 UNIT/ML IJ SOLN
INTRAMUSCULAR | Status: AC
  Filled 2022-03-20: qty 10

## 2022-03-20 MED ORDER — ONDANSETRON HCL 4 MG/2ML IJ SOLN: 4.0000 mg | Freq: Four times a day (QID) | INTRAMUSCULAR | Status: DC | PRN

## 2022-03-20 MED ORDER — HEPARIN SODIUM (PORCINE) 1000 UNIT/ML IJ SOLN
INTRAMUSCULAR | Status: DC | PRN
  Administered 2022-03-20: 5000 [IU] via INTRAVENOUS

## 2022-03-20 MED ORDER — HYDRALAZINE HCL 20 MG/ML IJ SOLN: 10.0000 mg | INTRAMUSCULAR | Status: DC | PRN

## 2022-03-20 MED ORDER — HEPARIN (PORCINE) IN NACL 1000-0.9 UT/500ML-% IV SOLN
INTRAVENOUS | Status: AC
  Filled 2022-03-20: qty 1000

## 2022-03-20 MED ORDER — VERAPAMIL HCL 2.5 MG/ML IV SOLN
INTRAVENOUS | Status: AC
  Filled 2022-03-20: qty 2

## 2022-03-20 MED ORDER — IOHEXOL 350 MG/ML SOLN
INTRAVENOUS | Status: DC | PRN
  Administered 2022-03-20: 65 mL

## 2022-03-20 SURGICAL SUPPLY — 10 items
CATH 5FR JL3.5 JR4 ANG PIG MP (CATHETERS) IMPLANT
CATH INFINITI 5 FR 3DRC (CATHETERS) IMPLANT
DEVICE RAD COMP TR BAND LRG (VASCULAR PRODUCTS) IMPLANT
GLIDESHEATH SLEND SS 6F .021 (SHEATH) IMPLANT
GUIDEWIRE INQWIRE 1.5J.035X260 (WIRE) IMPLANT
INQWIRE 1.5J .035X260CM (WIRE) ×1
KIT HEART LEFT (KITS) ×1 IMPLANT
PACK CARDIAC CATHETERIZATION (CUSTOM PROCEDURE TRAY) ×1 IMPLANT
TRANSDUCER W/STOPCOCK (MISCELLANEOUS) ×1 IMPLANT
TUBING CIL FLEX 10 FLL-RA (TUBING) ×1 IMPLANT

## 2022-03-20 NOTE — Interval H&P Note (Signed)
History and Physical Interval Note:  03/20/2022 7:11 AM  Collie A Kalmbach  has presented today for surgery, with the diagnosis of coronary artery disease and abnormal stress test.  The various methods of treatment have been discussed with the patient and family. After consideration of risks, benefits and other options for treatment, the patient has consented to  Procedure(s): LEFT HEART CATH AND CORONARY ANGIOGRAPHY (N/A) as a surgical intervention.  The patient's history has been reviewed, patient examined, no change in status, stable for surgery.  I have reviewed the patient's chart and labs.  Questions were answered to the patient's satisfaction.    Cath Lab Visit (complete for each Cath Lab visit)  Clinical Evaluation Leading to the Procedure:   ACS: No.  Non-ACS:    Anginal Classification: CCS I  Anti-ischemic medical therapy: Minimal Therapy (1 class of medications)  Non-Invasive Test Results: Intermediate-risk stress test findings: cardiac mortality 1-3%/year  Prior CABG: No previous CABG  Armanii Pressnell

## 2022-03-21 ENCOUNTER — Other Ambulatory Visit: Payer: Self-pay

## 2022-04-03 DIAGNOSIS — B351 Tinea unguium: Secondary | ICD-10-CM | POA: Diagnosis not present

## 2022-04-03 DIAGNOSIS — L300 Nummular dermatitis: Secondary | ICD-10-CM | POA: Diagnosis not present

## 2022-04-05 ENCOUNTER — Telehealth: Payer: Self-pay | Admitting: Cardiology

## 2022-04-05 NOTE — Telephone Encounter (Signed)
*  STAT* If patient is at the pharmacy, call can be transferred to refill team.   1. Which medications need to be refilled? (please list name of each medication and dose if known)  metoprolol succinate (TOPROL-XL) 50 MG 24 hr tablet   2. Which pharmacy/location (including street and city if local pharmacy) is medication to be sent to? [place], [place]   3. Do they need a 30 day or 90 day supply?  90 day supply

## 2022-04-06 ENCOUNTER — Other Ambulatory Visit: Payer: Self-pay | Admitting: Cardiology

## 2022-04-06 MED ORDER — METOPROLOL SUCCINATE ER 50 MG PO TB24: 50.0000 mg | ORAL_TABLET | Freq: Every day | ORAL | 0 refills | Status: DC

## 2022-05-14 NOTE — Assessment & Plan Note (Signed)
Well controlled.  No changes to medicines. Takes metoprolol 50 mg daily, Losartan 100 mg daily and aspirin 81 mg daily. Continue to work on eating a healthy diet and exercise.  Labs drawn today.

## 2022-05-14 NOTE — Progress Notes (Unsigned)
Subjective:  Patient ID: Robert Mckinney, male    DOB: September 16, 1955  Age: 67 y.o. MRN: [id number]  Chief Complaint  Patient presents with   Hypertension   Hyperlipidemia    HPI Hypertensive heart disease: Taking Metoprolol 50 mg daily, Aspirin 81 mg daily, Losartan 100 mg daily.   Eating healthy and exercising.  CORONARY ARTERY DISEASE:  LHC: Conclusions: Multivessel coronary artery disease, as detailed below, including 90% ostial ramus intermedius, 80% ostial OM1, and 70% distal RCA lesions.  There is also severe diffuse disease involving tiny diagonal and rPDA branches.  LMCA and ostial/proximal LAD demonstrate moderate disease. Normal left ventricular systolic function (LVEF A999333) with mildly elevated filling pressure (LVEDP 20 mmHg).   Recommendations: Continue aggressive medical therapy. Given absence of symptoms (e.g. chest pain and shortness of breath), favor deferring PCI to ramus intermedius and OM1 given ostial locations of lesions that could jeopardize larger vessels.  If symptoms develop, cardiac surgery consultation should be considered given unfavorable ramus intermedius/OM1 anatomy for PCI and moderate LMCA/LAD disease.  Hyperlipidemia. Atorvastatin 80 mg one before bed.   Using CPAP consistently every night and medically benefiting from its use.      05/15/2022    8:22 AM 11/09/2021    1:43 PM 09/20/2020    7:57 AM 06/04/2019    8:17 AM  Depression screen PHQ 2/9  Decreased Interest 0 0 0 0  Down, Depressed, Hopeless  0 0 0  PHQ - 2 Score 0 0 0 0         03/29/2020   11:04 AM 11/09/2021    1:46 PM 03/20/2022    6:15 AM 05/15/2022    8:22 AM  Fall Risk  Falls in the past year? 0 0  0  Was there an injury with Fall? 0 0  0  Fall Risk Category Calculator 0 0  0  Fall Risk Category (Retired) Low Low    (RETIRED) Patient Fall Risk Level  Low fall risk Moderate fall risk   Patient at Risk for Falls Due to  History of fall(s)  No Fall Risks  Fall risk Follow up  Falls  evaluation completed;Falls prevention discussed  Falls evaluation completed      Review of Systems  Constitutional:  Negative for chills, diaphoresis, fatigue and fever.  HENT:  Negative for congestion, ear pain and sore throat.   Respiratory:  Negative for cough and shortness of breath.   Cardiovascular:  Negative for chest pain and leg swelling.  Gastrointestinal:  Negative for abdominal pain, constipation, diarrhea, nausea and vomiting.  Genitourinary:  Negative for dysuria and urgency.  Musculoskeletal:  Negative for arthralgias and myalgias.  Neurological:  Negative for dizziness and headaches.  Psychiatric/Behavioral:  Negative for dysphoric mood.     Current Outpatient Medications on File Prior to Visit  Medication Sig Dispense Refill   aspirin EC 81 MG tablet Take 1 tablet (81 mg total) by mouth daily. Swallow whole. 30 tablet 11   atorvastatin (LIPITOR) 80 MG tablet Take 1 tablet (80 mg total) by mouth daily. 90 tablet 3   dextromethorphan-guaiFENesin (MUCINEX DM) 30-600 MG 12hr tablet Take 1 tablet by mouth 2 (two) times daily as needed (congestion).     losartan (COZAAR) 100 MG tablet Take 1 tablet (100 mg total) by mouth daily. 90 tablet 3   metoprolol succinate (TOPROL-XL) 50 MG 24 hr tablet Take 1 tablet (50 mg total) by mouth daily. Take with or immediately following a meal. 90 tablet 0  sildenafil (VIAGRA) 100 MG tablet Take 0.5-1 tablets (50-100 mg total) by mouth daily as needed for erectile dysfunction. (Patient not taking: Reported on 03/16/2022) 10 tablet 6   traZODone (DESYREL) 150 MG tablet TAKE ONE TABLET BY MOUTH AT BEDTIME 180 tablet 0   Current Facility-Administered Medications on File Prior to Visit  Medication Dose Route Frequency Provider Last Rate Last Admin   sodium chloride flush (NS) 0.9 % injection 3 mL  3 mL Intravenous Q12H Minus Breeding, MD       Past Medical History:  Diagnosis Date   Adjustment insomnia    Benign essential hypertension     Mixed hyperlipidemia    Obstructive sleep apnea (adult) (pediatric)    Other testicular dysfunction    Past Surgical History:  Procedure Laterality Date   APPENDECTOMY     KNEE SURGERY Bilateral    LEFT HEART CATH AND CORONARY ANGIOGRAPHY N/A 03/20/2022   Procedure: LEFT HEART CATH AND CORONARY ANGIOGRAPHY;  Surgeon: Nelva Bush, MD;  Location: [facility];  Service: Cardiovascular;  Laterality: N/A;   TONSILLECTOMY      History reviewed. No pertinent family history. Social History   Socioeconomic History   Marital status: Widowed    Spouse name: Not on file   Number of children: 2   Years of education: Not on file   Highest education level: Not on file  Occupational History   Occupation: Sales  Tobacco Use   Smoking status: Never   Smokeless tobacco: Never  Substance and Sexual Activity   Alcohol use: Yes    Alcohol/week: 1.0 standard drink of alcohol    Types: 1 Cans of beer per week    Comment: Once a week   Drug use: No   Sexual activity: Yes    Partners: Female  Other Topics Concern   Not on file  Social History Narrative   Lives with girlfriend.  Two sons and 4 grands.  Widow.    Social Determinants of Health   Financial Resource Strain: Low Risk  (11/09/2021)   Overall Financial Resource Strain (CARDIA)    Difficulty of Paying Living Expenses: Not hard at all  Food Insecurity: No Food Insecurity (11/09/2021)   Hunger Vital Sign    Worried About Running Out of Food in the Last Year: Never true    Ran Out of Food in the Last Year: Never true  Transportation Needs: No Transportation Needs (11/09/2021)   PRAPARE - Hydrologist (Medical): No    Lack of Transportation (Non-Medical): No  Physical Activity: Sufficiently Active (11/09/2021)   Exercise Vital Sign    Days of Exercise per Week: 7 days    Minutes of Exercise per Session: 30 min  Stress: No Stress Concern Present (11/09/2021)   [place]    Feeling of Stress : Not at all  Social Connections: Moderately Isolated (11/09/2021)   Social Connection and Isolation Panel [NHANES]    Frequency of Communication with Friends and Family: More than three times a week    Frequency of Social Gatherings with Friends and Family: Twice a week    Attends Religious Services: Never    Marine scientist or Organizations: No    Attends Archivist Meetings: Never    Marital Status: Living with partner    Objective:  BP 110/70   Pulse (!) 51   Temp (!) 97.3 F (36.3 C)   Ht 5'  9" (1.753 m)   Wt 238 lb (108 kg)   SpO2 98%   BMI 35.15 kg/m      05/15/2022    8:22 AM 03/20/2022   10:42 AM 03/20/2022   10:30 AM  BP/Weight  Systolic BP A999333 [id number]  Diastolic BP 70 60 64  Wt. (Lbs) 238    BMI 35.15 kg/m2      Physical Exam Vitals reviewed.  Constitutional:      Appearance: Normal appearance.  Neck:     Vascular: No carotid bruit.  Cardiovascular:     Rate and Rhythm: Normal rate and regular rhythm.     Pulses: Normal pulses.     Heart sounds: Normal heart sounds.  Pulmonary:     Effort: Pulmonary effort is normal.     Breath sounds: Normal breath sounds. No wheezing, rhonchi or rales.  Abdominal:     General: Bowel sounds are normal.     Palpations: Abdomen is soft.     Tenderness: There is no abdominal tenderness.  Neurological:     Mental Status: He is alert and oriented to person, place, and time.  Psychiatric:        Mood and Affect: Mood normal.        Behavior: Behavior normal.     Diabetic Foot Exam - Simple   No data filed      Lab Results  Component Value Date   WBC 6.0 03/13/2022   HGB 14.3 03/13/2022   HCT 41.5 03/13/2022   PLT 161 03/13/2022   GLUCOSE 84 03/13/2022   CHOL 110 10/17/2021   TRIG 144 10/17/2021   HDL 38 (L) 10/17/2021   LDLDIRECT 84 04/21/2019   LDLCALC 47 10/17/2021   ALT 25 06/28/2021   AST 17 06/28/2021   NA 142 03/13/2022   K  4.6 03/13/2022   CL 107 (H) 03/13/2022   CREATININE 1.25 03/13/2022   BUN 15 03/13/2022   CO2 23 03/13/2022   TSH 1.550 11/27/2019   INR 0.98 12/13/2009      Assessment & Plan:    Coronary artery disease involving native coronary artery of native heart without angina pectoris Assessment & Plan: Continue with aspirin and atorvastatin.   Mixed hyperlipidemia Assessment & Plan: Well controlled.  No changes to medicines. Takes Atorvastatin 80 mg daily. Continue to work on eating a healthy diet and exercise.  Labs drawn today.    Orders: -     Lipid panel -     TSH  Adjustment insomnia Assessment & Plan: The current medical regimen is effective;  continue present plan and medications.  Taking Trazodone 150 mg at bedtime.   Need for hepatitis C screening test -     HCV Ab w Reflex to Quant PCR  Hypertensive heart disease without heart failure Assessment & Plan: Well controlled.  No changes to medicines. Takes metoprolol 50 mg daily, Losartan 100 mg daily and aspirin 81 mg daily. Continue to work on eating a healthy diet and exercise.  Labs drawn today.    Orders: -     Comprehensive metabolic panel -     CBC with Differential/Platelet  Class 2 severe obesity due to excess calories with serious comorbidity and body mass index (BMI) of 35.0 to 35.9 in adult [facility]) Assessment & Plan: Comorbidities: hypertension and hyperlipidemia Recommend continue to work on eating healthy diet and exercise.    OSA (obstructive sleep apnea) Assessment & Plan: Continue cpap. Compliant and benfiting.  No orders of the defined types were placed in this encounter.   Orders Placed This Encounter  Procedures   Comprehensive metabolic panel   Lipid panel   CBC with Differential/Platelet   TSH   HCV Ab w Reflex to Quant PCR     Follow-up: Return in about 6 months (around 11/15/2022) for chronic fasting.   I,Marla I Leal-Borjas,acting as a scribe for [name],  MD.,have documented all relevant documentation on the behalf of [name], MD,as directed by  [name], MD while in the presence of [name], MD.   An After Visit Summary was printed and given to the patient.  I attest that I have reviewed this visit and agree with the plan scribed by my staff.   [name], MD [facility] [phone]

## 2022-05-14 NOTE — Assessment & Plan Note (Signed)
Continue with aspirin and atorvastatin.

## 2022-05-14 NOTE — Assessment & Plan Note (Signed)
The current medical regimen is effective;  continue present plan and medications.  Taking Trazodone 150 mg at bedtime.

## 2022-05-14 NOTE — Assessment & Plan Note (Signed)
Well controlled.  No changes to medicines. Takes Atorvastatin 80 mg daily. Continue to work on eating a healthy diet and exercise.  Labs drawn today.

## 2022-05-15 ENCOUNTER — Ambulatory Visit (INDEPENDENT_AMBULATORY_CARE_PROVIDER_SITE_OTHER): Payer: HMO | Admitting: Family Medicine

## 2022-05-15 ENCOUNTER — Encounter: Payer: Self-pay | Admitting: Family Medicine

## 2022-05-15 VITALS — BP 110/70 | HR 51 | Temp 97.3°F | Ht 69.0 in | Wt 238.0 lb

## 2022-05-15 DIAGNOSIS — F5102 Adjustment insomnia: Secondary | ICD-10-CM | POA: Diagnosis not present

## 2022-05-15 DIAGNOSIS — I2510 Atherosclerotic heart disease of native coronary artery without angina pectoris: Secondary | ICD-10-CM | POA: Diagnosis not present

## 2022-05-15 DIAGNOSIS — G4733 Obstructive sleep apnea (adult) (pediatric): Secondary | ICD-10-CM

## 2022-05-15 DIAGNOSIS — I10 Essential (primary) hypertension: Secondary | ICD-10-CM

## 2022-05-15 DIAGNOSIS — Z1159 Encounter for screening for other viral diseases: Secondary | ICD-10-CM | POA: Diagnosis not present

## 2022-05-15 DIAGNOSIS — Z6835 Body mass index (BMI) 35.0-35.9, adult: Secondary | ICD-10-CM | POA: Diagnosis not present

## 2022-05-15 DIAGNOSIS — E782 Mixed hyperlipidemia: Secondary | ICD-10-CM | POA: Diagnosis not present

## 2022-05-15 DIAGNOSIS — I119 Hypertensive heart disease without heart failure: Secondary | ICD-10-CM

## 2022-05-16 DIAGNOSIS — Z1159 Encounter for screening for other viral diseases: Secondary | ICD-10-CM | POA: Insufficient documentation

## 2022-05-16 NOTE — Assessment & Plan Note (Addendum)
Comorbidities: hypertension and hyperlipidemia Recommend continue to work on eating healthy diet and exercise.  

## 2022-05-16 NOTE — Assessment & Plan Note (Signed)
Continue cpap. Compliant and benfiting.

## 2022-05-21 ENCOUNTER — Other Ambulatory Visit: Payer: Self-pay

## 2022-05-21 DIAGNOSIS — E782 Mixed hyperlipidemia: Secondary | ICD-10-CM

## 2022-05-21 DIAGNOSIS — I119 Hypertensive heart disease without heart failure: Secondary | ICD-10-CM

## 2022-05-22 ENCOUNTER — Other Ambulatory Visit: Payer: HMO

## 2022-05-22 DIAGNOSIS — E782 Mixed hyperlipidemia: Secondary | ICD-10-CM | POA: Diagnosis not present

## 2022-05-22 DIAGNOSIS — I119 Hypertensive heart disease without heart failure: Secondary | ICD-10-CM | POA: Diagnosis not present

## 2022-05-22 DIAGNOSIS — Z1159 Encounter for screening for other viral diseases: Secondary | ICD-10-CM | POA: Diagnosis not present

## 2022-05-23 LAB — COMPREHENSIVE METABOLIC PANEL
ALT: 39 IU/L (ref 0–44)
AST: 22 IU/L (ref 0–40)
Albumin/Globulin Ratio: 2.3 — ABNORMAL HIGH (ref 1.2–2.2)
Albumin: 4.6 g/dL (ref 3.9–4.9)
Alkaline Phosphatase: 94 IU/L (ref 44–121)
BUN/Creatinine Ratio: 10 (ref 10–24)
BUN: 12 mg/dL (ref 8–27)
Bilirubin Total: 0.8 mg/dL (ref 0.0–1.2)
CO2: 23 mmol/L (ref 20–29)
Calcium: 10.6 mg/dL — ABNORMAL HIGH (ref 8.6–10.2)
Chloride: 107 mmol/L — ABNORMAL HIGH (ref 96–106)
Creatinine, Ser: 1.17 mg/dL (ref 0.76–1.27)
Globulin, Total: 2 g/dL (ref 1.5–4.5)
Glucose: 100 mg/dL — ABNORMAL HIGH (ref 70–99)
Potassium: 4.6 mmol/L (ref 3.5–5.2)
Sodium: 147 mmol/L — ABNORMAL HIGH (ref 134–144)
Total Protein: 6.6 g/dL (ref 6.0–8.5)
eGFR: 69 mL/min/{1.73_m2} (ref >59)

## 2022-05-23 LAB — CBC WITH DIFFERENTIAL/PLATELET
Basophils Absolute: 0 10*3/uL (ref 0.0–0.2)
Basos: 0 %
EOS (ABSOLUTE): 0.2 10*3/uL (ref 0.0–0.4)
Eos: 3 %
Hematocrit: 47.1 % (ref 37.5–51.0)
Hemoglobin: 16 g/dL (ref 13.0–17.7)
Immature Grans (Abs): 0 10*3/uL (ref 0.0–0.1)
Immature Granulocytes: 0 %
Lymphocytes Absolute: 1.5 10*3/uL (ref 0.7–3.1)
Lymphs: 28 %
MCH: 31.9 pg (ref 26.6–33.0)
MCHC: 34 g/dL (ref 31.5–35.7)
MCV: 94 fL (ref 79–97)
Monocytes Absolute: 0.5 10*3/uL (ref 0.1–0.9)
Monocytes: 10 %
Neutrophils Absolute: 3 10*3/uL (ref 1.4–7.0)
Neutrophils: 59 %
Platelets: 146 10*3/uL — ABNORMAL LOW (ref 150–450)
RBC: 5.01 x10E6/uL (ref 4.14–5.80)
RDW: 12.9 % (ref 11.6–15.4)
WBC: 5.2 10*3/uL (ref 3.4–10.8)

## 2022-05-23 LAB — LIPID PANEL
Chol/HDL Ratio: 3.2 ratio (ref 0.0–5.0)
Cholesterol, Total: 121 mg/dL (ref 100–199)
HDL: 38 mg/dL — ABNORMAL LOW (ref >39)
LDL Chol Calc (NIH): 57 mg/dL (ref 0–99)
Triglycerides: 151 mg/dL — ABNORMAL HIGH (ref 0–149)
VLDL Cholesterol Cal: 26 mg/dL (ref 5–40)

## 2022-05-23 LAB — HCV INTERPRETATION

## 2022-05-23 LAB — TSH: TSH: 1.47 u[IU]/mL (ref 0.450–4.500)

## 2022-05-23 LAB — HCV AB W REFLEX TO QUANT PCR: HCV Ab: NONREACTIVE

## 2022-05-23 LAB — CARDIOVASCULAR RISK ASSESSMENT

## 2022-06-11 NOTE — Progress Notes (Unsigned)
  Cardiology Office Note:   Date:  06/12/2022  ID:  Robert Mckinney, DOB 07-Sep-1955, MRN [id number]  History of Present Illness:   Robert Mckinney is a 67 y.o. male who presents for evaluation of CAD.  He was noted to have coronary calcium on a recent CT.  I sent him for a POET (Plain Old Exercise Treadmill) which was equivocal.  Coronary CT demonstrated LAD proximal stenosis that was said to be 70 to 99%, intermediate 25 to 49% stenosis, D2 50 to 69% stenosis and mild plaque elsewhere.  His calcium score was 937.  However, his FFR was positive only in the distal vessel.  He had gone greater than 9 minutes on the treadmill without ST changes.  He had no symptoms.  We had a long conversation it was decided to manage him medically with optimal medical therapy.  I did review his films with some of my partners.  At the last visit I sent him for cath and he was found to have non obstructive and small vessel disease.   Since I saw him he has been on a diet and lost about 14 pounds.  He is trying to exercise 5 days a week. The patient denies any new symptoms such as chest discomfort, neck or arm discomfort. There has been no new shortness of breath, PND or orthopnea. There have been no reported palpitations, presyncope or syncope.   ROS: As stated in the HPI and negative for all other systems.  Studies Reviewed:    EKG:  NA  Cath 03/20/22 Diagnostic Dominance: Right   Risk Assessment/Calculations:      Physical Exam:   VS:  BP 120/84   Pulse (!) 46   Ht 5\' 9"  (1.753 m)   Wt 230 lb 3.2 oz (104.4 kg)   SpO2 97%   BMI 33.99 kg/m    Wt Readings from Last 3 Encounters:  06/12/22 230 lb 3.2 oz (104.4 kg)  05/15/22 238 lb (108 kg)  03/20/22 235 lb (106.6 kg)     GEN: Well nourished, well developed in no acute distress NECK: No JVD; No carotid bruits CARDIAC: RRR, no murmurs, rubs, gallops RESPIRATORY:  Clear to auscultation without rales, wheezing or rhonchi  ABDOMEN: Soft, non-tender,  non-distended EXTREMITIES:  No edema; No deformity   ASSESSMENT AND PLAN:   CAD   He was managed medically with disease as above.  We had a long conversation again about diet and exercise and I think they are well on their way.  I reviewed the images and the patient today.   DYSLIPIDEMIA: LDL goal is and he is was 57.  I am going to check an LP(a).  I will also check an A1c today.   HTN:    BP is at target.  No change in therapy.   OBESTIY:    I congratulated him on his weight loss and encouraged more of the same.  We again had an extensive conversation about diet.      Signed, Rollene Rotunda, MD

## 2022-06-12 ENCOUNTER — Ambulatory Visit: Payer: HMO | Attending: Cardiology | Admitting: Cardiology

## 2022-06-12 ENCOUNTER — Encounter: Payer: Self-pay | Admitting: Cardiology

## 2022-06-12 VITALS — BP 120/84 | HR 46 | Ht 69.0 in | Wt 230.2 lb

## 2022-06-12 DIAGNOSIS — E785 Hyperlipidemia, unspecified: Secondary | ICD-10-CM

## 2022-06-12 DIAGNOSIS — I10 Essential (primary) hypertension: Secondary | ICD-10-CM

## 2022-06-12 DIAGNOSIS — I2510 Atherosclerotic heart disease of native coronary artery without angina pectoris: Secondary | ICD-10-CM

## 2022-06-12 NOTE — Patient Instructions (Addendum)
Medication Instructions:  Your physician recommends that you continue on your current medications as directed. Please refer to the Current Medication list given to you today.  *If you need a refill on your cardiac medications before your next appointment, please call your pharmacy*   Lab Work: Your physician recommends that you have labs drawn today: LPa & A1C  Your physician recommends that you return for lab work in: 4 months for FASTING Lipid panel  If you have labs (blood work) drawn today and your tests are completely normal, you will receive your results only by: MyChart Message (if you have MyChart) OR A paper copy in the mail If you have any lab test that is abnormal or we need to change your treatment, we will call you to review the results.    Follow-Up: At [facility], you and your health needs are our priority.  As part of our continuing mission to provide you with exceptional heart care, we have created designated Provider Care Teams.  These Care Teams include your primary Cardiologist (physician) and Advanced Practice Providers (APPs -  Physician Assistants and Nurse Practitioners) who all work together to provide you with the care you need, when you need it.  We recommend signing up for the patient portal called "MyChart".  Sign up information is provided on this After Visit Summary.  MyChart is used to connect with patients for Virtual Visits (Telemedicine).  Patients are able to view lab/test results, encounter notes, upcoming appointments, etc.  Non-urgent messages can be sent to your provider as well.   To learn more about what you can do with MyChart, go to [email or web address].    Your next appointment:   12 month(s)  Provider:   Rollene Rotunda, MD

## 2022-06-13 LAB — HEMOGLOBIN A1C
Est. average glucose Bld gHb Est-mCnc: 105 mg/dL
Hgb A1c MFr Bld: 5.3 % (ref 4.8–5.6)

## 2022-06-13 LAB — LIPOPROTEIN A (LPA): Lipoprotein (a): 10.5 nmol/L (ref <75.0)

## 2022-06-19 ENCOUNTER — Encounter: Payer: Self-pay | Admitting: *Deleted

## 2022-06-27 ENCOUNTER — Ambulatory Visit (INDEPENDENT_AMBULATORY_CARE_PROVIDER_SITE_OTHER): Payer: HMO | Admitting: Internal Medicine

## 2022-06-27 ENCOUNTER — Encounter: Payer: Self-pay | Admitting: Internal Medicine

## 2022-06-27 VITALS — BP 118/64 | HR 59 | Ht 69.0 in | Wt 230.0 lb

## 2022-06-27 DIAGNOSIS — R053 Chronic cough: Secondary | ICD-10-CM

## 2022-06-27 DIAGNOSIS — R918 Other nonspecific abnormal finding of lung field: Secondary | ICD-10-CM

## 2022-06-27 NOTE — Patient Instructions (Addendum)
Chronic cough - Abnormal CT scan of lung  -- Clinically stable with some mild intermittent occasional cough and pleuritic sinus drainage -Need to follow-up on CT scan from end of 2022   Plan  - Do high-resolution CT chest supine and prone inspiratory and expiratory Volume in the next few to several weeks -Use saline nasal spray or over-the-counter nasal steroid as needed if sinuses are too congested   Follow-up - video visit to discuss CT scan results if required -Otherwise return in 1 year

## 2022-06-27 NOTE — Progress Notes (Signed)
OV 12/23/2020  Subjective:  Patient ID: Robert Mckinney, male , DOB: [birth year] , age 67 y.o. , MRN: [id number] , ADDRESS: [address] [place] [address] PCP Lillard Anes, MD Patient Care Team: Lillard Anes, MD as PCP - General (Family Medicine)  This Provider for this visit: Treatment Team:  Attending Provider: Brand Males, MD    12/23/2020 -   Chief Complaint  Patient presents with   Consult    Pt has had a cough for more than 12 years which he states comes and goes. States his cough is worse when he is talking as he has problems with dry throat and also has problems with cold weather.     HPI Robert Mckinney 67 y.o. -accompanied by wife.  He is a son-in-law of my former patient Mr. Robert Mckinney who died from IPF.  He is a chronic cough for over 10 years.  He tells me that many years ago he was diagnosed with hypertension and put on lisinopril and that is when cough started but around 2 years ago when his first wife had cancer he had stopped his lisinopril for 2 years and then restarted it approximately 2 years ago.  Stopping the lisinopril did not make any impact on his cough.  The cough is continued.  He did have COVID in September 2022 b.  He says that periodically when the cough just gets worse he will take amoxicillin and it temporarily helps.  He also states the cough is the almost every day on and off.  RSI cough score is 13.  The COVID did increase the cough.\And that is why he is here.  The cough is made worse by talking a lot [is a salesman for cleaning products].  Also cold air and salty food makes the cough worse.  Cough is worse in the day overnight but sometimes there at night.  There is no wheezing.  He feels his throat is dry and also parched.  He feels sometimes food gets stuck in his throat.  There is no wheezing.  He used to have heartburn but he started taking Prilosec and this is resolved but did not make any difference in the cough.   There is no postnasal drainage.  No known asthmatic.  Last chest x-ray 2011 and this reported as clear.  We did discuss COVID vaccination in the setting of recent September 2022 BA/5 COVID-infection.  He recovered well from it.  He is going to wait few months before making a decision on taking mRNA booster.   CT Chest data  No results found.  Narrative  Clinical Data: Preoperative respiratory exam.  Quadriceps tendon  tear.     CHEST - 2 VIEW     Comparison: None.     Findings: The heart size and vascularity are normal and the lungs  are clear.  No osseous abnormality.     IMPRESSION:  Normal chest.   Provider: Kathreen Cosier   Results for SHERWIN, HOLLINGSHED (MRN [id number]) as of 12/23/2020 11:45  Ref. Range 12/13/2009 13:48 04/21/2019 09:56 09/20/2020 10:35  Eos Latest Ref Range: Not Estab. %  3 4     OV 02/11/2021  Subjective:  Patient ID: Robert Mckinney, male , DOB: 08-29-1955 , age 67 y.o. , MRN: [id number] , ADDRESS: [address] [place] [address] PCP Lillard Anes, MD Patient Care Team: Lillard Anes, MD as PCP - General (Family Medicine)  This Provider for this visit: Treatment Team:  Attending Provider: Kalman Shan, MD    02/11/2021 -   Chief Complaint  Patient presents with   Follow-up    Pft done today.    Follow-up chronic cough. HPI Robert Mckinney 67 y.o. -presents with his significant other Robert Mckinney.  Since his last visit he stopped ACE inhibitor and his cough is better according to his history but RSI cough score shows the cough is still persistent.  His blood allergy test is significantly positive for significant environmental allergens.  In addition is also positive for Aspergillus alternator.  But his blood IgE itself is negative.  His high-resolution CT chest that I personally visualized and also showed him has micronodular appearance raising the concern for hypersensitivity pneumonitis.  Asked him some exposure questions: He  moved to his house that was built in the 1950s in the late 1980s and lived until 2 years ago.  Since then he has been living with his significant other.  In his previous home according to the significant the there is always some mold or musty smell.  Patient himself has not seen any mold.  He goes that periodically to spend a couple of hours to do some work.  O he was have a discharge from the house and that house.  Currently in his partners house he uses a blanket that has feather pillows.  His pulmonary function test does show restriction [he is obese] with increased DLCO.   In addition he has coronary artery calcification.   No results found for: NITRICOXIDE   Latest Reference Range & Units 12/23/20 12:16  Allergen, A. alternata, m6 kU/L 0.10 (H)  Allergen, P. notatum, m1 kU/L <0.10  CLADOSPORIUM HERBARUM (M2) IGE kU/L <0.10  (H): Data is abnormally high   Latest Reference Range & Units 12/23/20 12:16  Sheep Sorrel IgE kU/L 1.68 (H)  Pecan/Hickory Tree IgE kU/L 1.17 (H)  IgE (Immunoglobulin E), Serum <OR=114 kU/L 62  Allergen, D pternoyssinus,d7 kU/L 0.15 (H)  Cat Dander kU/L <0.10  Dog Dander kU/L <0.10  [place] Grass kU/L 1.40 (H)  Johnson Grass kU/L 1.50 (H)  Timothy Grass kU/L 1.69 (H)  Cockroach kU/L 0.76 (H)  Aspergillus fumigatus, m3 kU/L <0.10  Allergen, Comm Silver Charletta Cousin, t9 kU/L 0.71 (H)  Allergen, Cottonwood, t14 kU/L 0.98 (H)  Elm IgE kU/L 1.61 (H)  Allergen, Mulberry, t76 kU/L 0.81 (H)  Allergen, Oak,t7 kU/L 1.55 (H)  COMMON RAGWEED (SHORT) (W1) IGE kU/L 1.65 (H)  Allergen, Mouse Urine Protein, e78 kU/L <0.10  D. farinae kU/L 0.15 (H)  Allergen, Cedar tree, t12 kU/L 0.95 (H)  Box Elder IgE kU/L 1.59 (H)  Rough Pigweed  IgE kU/L 1.26 (H)  (H): Data is abnormally high CT Chest data  No results found.  Narrative & Impression  CLINICAL DATA:  Persistent cough for 12 years, nonsmoker, recent COVID exacerbated cough   EXAM: CT CHEST WITHOUT CONTRAST    TECHNIQUE: Multidetector CT imaging of the chest was performed following the standard protocol without intravenous contrast. High resolution imaging of the lungs, as well as inspiratory and expiratory imaging, was performed.   COMPARISON:  None.   FINDINGS: Cardiovascular: No significant vascular findings. Normal heart size. Three-vessel coronary artery calcifications. No pericardial effusion.   Mediastinum/Nodes: No enlarged mediastinal, hilar, or axillary lymph nodes. Thyroid gland, trachea, and esophagus demonstrate no significant findings.   Lungs/Pleura: There are innumerable tiny centrilobular pulmonary nodules scattered throughout the lungs. Lobular air trapping on expiratory  phase imaging. No pleural effusion or pneumothorax.   Upper Abdomen: No acute abnormality.   Musculoskeletal: No chest wall mass or suspicious bone lesions identified.   IMPRESSION: 1. There are innumerable tiny centrilobular pulmonary nodules scattered throughout the lungs. In conjunction with air trapping, findings are suggestive of hypersensitivity pneumonitis. No evidence of fibrosis at this time. 2. Coronary artery disease.     Electronically Signed   By: Jearld Lesch M.D.   On: 01/20/2021 09:49     MDD case connferece Jan 2023  "65y old came for chronic cough that was c/w cough neuropathy and ace inhibitor. Better partially after stopping ace inhibitor. HRCT shows features of HP. Tells me he was living till 2y ago in a house that has mold smell. Now living with GF - has feather blanket. Perioidcally goes to old house.  QUESTION- how confident are we wtih HP features? What is the best biopsy method? Should we do a biopsy at all?" HRCT: 01/19/2021  official read - is HP dx.  ENTRIKIN READ: small pumonary nodules in random distribution. Does not strike as centrilobular predomonnant. Some very mild AT. Overall features are not c/w  HP. Shanon Rosser with [facility] and disagrees with HP dx.  REC: Follow with 1 year CT. No need for biopsy   Clinical discussion: jsut follow along. If patient petrified of ILD - do BAL but no bx  OV 04/21/2021  Subjective:  Patient ID: Robert Mckinney, male , DOB: Dec 09, 1955 , age 57 y.o. , MRN: [id number] , ADDRESS: [address] [place] [address] PCP Abigail Miyamoto, MD Patient Care Team: Abigail Miyamoto, MD as PCP - General (Family Medicine)  This Provider for this visit: Treatment Team:  Attending Provider: Kalman Shan, MD    04/21/2021 -   Chief Complaint  Patient presents with   Follow-up    Pt states since last visit, his cough had gotten better until he was prescribed rosuvastatin which that made the cough worse which that med was then stopped.    HPI Robert Mckinney 67 y.o. -presents with Robert Mckinney his significant other.  He tells me that since last visit his cough is actually better although on the RSI cough score it might be worse at the same.  Since last seeing me he did see cardiology for coronary artery calcification and apparently there was discussion about whether he should have a stent or just expectant follow-up because he is clinically asymptomatic.  Nevertheless because of his cough which is he is feeling better.  Although in the interim when he took rosuvastatin his cough got significantly worse and he had to stop it.  Otherwise he is feeling fine.  He says that there are 2 types of cough 1 that comes from the throat and sometimes from the chest but it is a throat component that is significantly worse.  In the last few days he has noticed some loosening of the cough and some phlegm and so overall he feels the cough is significantly better.  He is content taking a more conservative approach  In terms of the nodules did discuss the case conference.  The case conference disagreed with the official radiologist that this was consistent with HP.  I shared this with him.  The general consensus that is to follow  him along and to only consider bronchoscopy with lavage if he is very anxious about it.  He is perfectly fine at this point watching clinically.  He wants  to get through his cardiology work-up.  I also indicated that I would like to see resolution of the cardiac issue before proceeding with a bronchoscopy although bronchoscopy is not contraindicated at this point.  His blood work for serology and hypersensitive pneumonitis panel is negative.  CT Chest data  No results found.     OV 06/27/2022  Subjective:  Patient ID: Robert Mckinney, male , DOB: [birth year] , age 59 y.o. , MRN: [id number] , ADDRESS: [address] [place] [address] PCP Blane Ohara, MD Patient Care Team: Blane Ohara, MD as PCP - General (Internal Medicine) Rollene Rotunda, MD as PCP - Cardiology (Cardiology)  This Provider for this visit: Treatment Team:  Attending Provider: Kalman Shan, MD  Follow-up chronic cough with features of ACE inhibitor and irritable larynx syndrome Follow-up small nodules in the upper lobe [facility] radiology report suggestive of HP but multidisciplinary case conference report is inconsistent with HP] Has history of mold exposure Positive RAST Panel 2022 but no symptoms mowing lawn  06/27/2022 -   Chief Complaint  Patient presents with   Follow-up    F/U on cough. States his cough has improved after D/Cing his blood pressure medication (lisinopril). Mucinex has been helping as well.      HPI Robert Mckinney 67 y.o. -returns for follow-up.  Presents with his significant other Robert Mckinney.  In the last 1 year overall he is stable.  He is overall improved.  He has an occasional cough.  He has intermittent occasional sinus drainage.  Cough comes on with laughing.  Sometimes the sinus drainage gets bothered.  He says in the allergy season has been mowing the yard and this does not bother him.  He does not mask.  He has significant RAST positive allergy panel but the grass does not bother  him he says.  There is no shortness of breath or wheezing.  His last CT scan of the chest was in 2022: It did show some micronodules.  1 radiologist thought it was hypersensitive pneumonitis.  Second conference radiologist  disagreed.  I recommended repeating this high-res CT chest and he is agreeable.  There is currently no mold exposure.     Dr Gretta Cool Reflux Symptom Index (> 13-15 suggestive of LPR cough) 0 -> 5  =  none ->severe problem 12/23/2020  02/11/2021 Bettter after stoppin ace inhibitor 04/21/2021 Feels better  Hoarseness of problem with voice 0 0 0  Clearing  Of Throat 2 0 3  Excess throat mucus or feeling of post nasal drip 0 2 5  Difficulty swallowing food, liquid or tablets 0 0 0  Cough after eating or lying down 2 4 3   Breathing difficulties or choking episodes 2 3 0  Troublesome or annoying cough 5 3 4   Sensation of something sticking in throat or lump in throat 0 0 5  Heartburn, chest pain, indigestion, or stomach acid coming up 2 4 0  TOTAL 13 16 20    PFT     Latest Ref Rng & Units 02/10/2021   12:38 PM  PFT Results  FVC-Pre L 3.55  P  FVC-Predicted Pre % 77  P  FVC-Post L 3.60  P  FVC-Predicted Post % 78  P  Pre FEV1/FVC % % 81  P  Post FEV1/FCV % % 84  P  FEV1-Pre L 2.87  P  FEV1-Predicted Pre % 83  P  FEV1-Post L 3.01  P  DLCO uncorrected ml/min/mmHg 32.70  P  DLCO UNC% %  122  P  DLCO corrected ml/min/mmHg 30.53  P  DLCO COR %Predicted % 114  P  DLVA Predicted % 127  P  TLC L 7.11  P  TLC % Predicted % 101  P  RV % Predicted % 88  P    P Preliminary result       has a past medical history of Adjustment insomnia, Benign essential hypertension, Mixed hyperlipidemia, Obstructive sleep apnea (adult) (pediatric), and Other testicular dysfunction.   reports that he has never smoked. He has never used smokeless tobacco.  Past Surgical History:  Procedure Laterality Date   APPENDECTOMY     KNEE SURGERY Bilateral    LEFT HEART CATH AND  CORONARY ANGIOGRAPHY N/A 03/20/2022   Procedure: LEFT HEART CATH AND CORONARY ANGIOGRAPHY;  Surgeon: Yvonne Kendall, MD;  Location: [facility];  Service: Cardiovascular;  Laterality: N/A;   TONSILLECTOMY      Allergies  Allergen Reactions   Rosuvastatin Calcium Cough    Immunization History  Administered Date(s) Administered   Fluad Quad(high Dose 65+) 12/12/2018, 11/24/2020   Influenza Inj Mdck Quad Pf 11/13/2019   Influenza-Unspecified 12/18/2021   Moderna Sars-Covid-2 Vaccination 06/23/2020   PFIZER(Purple Top)SARS-COV-2 Vaccination 04/14/2019, 05/09/2019, 12/04/2019   PNEUMOCOCCAL CONJUGATE-20 11/09/2021   Tdap 11/27/2019   Zoster Recombinat (Shingrix) 11/27/2019, 01/27/2020    No family history on file.   Current Outpatient Medications:    aspirin EC 81 MG tablet, Take 1 tablet (81 mg total) by mouth daily. Swallow whole., Disp: 30 tablet, Rfl: 11   atorvastatin (LIPITOR) 80 MG tablet, Take 1 tablet (80 mg total) by mouth daily., Disp: 90 tablet, Rfl: 3   dextromethorphan-guaiFENesin (MUCINEX DM) 30-600 MG 12hr tablet, Take 1 tablet by mouth 2 (two) times daily as needed (congestion)., Disp: , Rfl:    losartan (COZAAR) 100 MG tablet, Take 1 tablet (100 mg total) by mouth daily., Disp: 90 tablet, Rfl: 3   metoprolol succinate (TOPROL-XL) 50 MG 24 hr tablet, Take 1 tablet (50 mg total) by mouth daily. Take with or immediately following a meal., Disp: 90 tablet, Rfl: 0   traZODone (DESYREL) 150 MG tablet, TAKE ONE TABLET BY MOUTH AT BEDTIME, Disp: 180 tablet, Rfl: 0      Objective:   Vitals:   06/27/22 1416  BP: 118/64  Pulse: (!) 59  SpO2: 97%  Weight: 230 lb (104.3 kg)  Height: 5\' 9"  (1.753 m)    Estimated body mass index is 33.97 kg/m as calculated from the following:   Height as of this encounter: 5\' 9"  (1.753 m).   Weight as of this encounter: 230 lb (104.3 kg).  @WEIGHTCHANGE @  [employer]   06/27/22 1416  Weight: 230 lb (104.3 kg)      Physical Exam    General: No distress. Looks well Neuro: Alert and Oriented x 3. GCS 15. Speech normal Psych: Pleasant Resp:  Barrel Chest - no.  Wheeze - no, Crackles - no, No overt respiratory distress CVS: Normal heart sounds. Murmurs - no Ext: Stigmata of Connective Tissue Disease - no HEENT: Normal upper airway. PEERL +. No post nasal drip        Assessment:       ICD-10-CM   1. Abnormal CT scan of lung  R91.8     2. Chronic cough  R05.3          Plan:     Patient Instructions  Chronic cough - Abnormal CT scan of lung  -- Clinically stable  with some mild intermittent occasional cough and pleuritic sinus drainage -Need to follow-up on CT scan from end of 2022   Plan  - Do high-resolution CT chest supine and prone inspiratory and expiratory Volume in the next few to several weeks -Use saline nasal spray or over-the-counter nasal steroid as needed if sinuses are too congested   Follow-up - video visit to discuss CT scan results if required -Otherwise return in 1 year    SIGNATURE    Dr. Kalman Shan, M.D., F.C.C.P,  Pulmonary and Critical Care Medicine Staff Physician, [facility] Director - Interstitial Lung Disease  Program  Pulmonary Fibrosis [facility] at [facility] [place], [place], [address]  Pager: [phone], If no answer or between  15:00h - 7:00h: call [phone] Telephone: [phone]  2:56 PM 06/27/2022

## 2022-06-27 NOTE — Addendum Note (Signed)
Addended by: Maurene Capes on: 06/27/2022 02:59 PM   Modules accepted: Orders

## 2022-07-08 ENCOUNTER — Ambulatory Visit (HOSPITAL_BASED_OUTPATIENT_CLINIC_OR_DEPARTMENT_OTHER): Payer: HMO

## 2022-07-21 ENCOUNTER — Ambulatory Visit (HOSPITAL_BASED_OUTPATIENT_CLINIC_OR_DEPARTMENT_OTHER)
Admission: RE | Admit: 2022-07-21 | Discharge: 2022-07-21 | Disposition: A | Discharge status: Home / Self Care | Payer: HMO | Source: Ambulatory Visit | Attending: Internal Medicine | Admitting: Internal Medicine

## 2022-07-21 DIAGNOSIS — R918 Other nonspecific abnormal finding of lung field: Secondary | ICD-10-CM | POA: Diagnosis not present

## 2022-07-21 DIAGNOSIS — R053 Chronic cough: Secondary | ICD-10-CM | POA: Diagnosis not present

## 2022-08-07 ENCOUNTER — Other Ambulatory Visit: Payer: Self-pay | Admitting: Cardiology

## 2022-08-07 DIAGNOSIS — I10 Essential (primary) hypertension: Secondary | ICD-10-CM

## 2022-08-25 IMAGING — CT CT CHEST HIGH RESOLUTION
2 of 8 series · 14 of 36 positions shown, 17 images · non-contrast
Comparison: None.

CLINICAL DATA: Persistent cough for 12 years, nonsmoker, recent
COVID exacerbated cough

EXAM:
CT CHEST WITHOUT CONTRAST
TECHNIQUE: Multidetector CT imaging of the chest was performed following the
standard protocol without intravenous contrast. High resolution
imaging of the lungs, as well as inspiratory and expiratory imaging,
was performed.

[Series 5: high resolution · axial · 0.77mm/px · z∈[-332,-61]mm · 11 of 327 slices shown, 14 images]
[im 28/327  mediastinal]
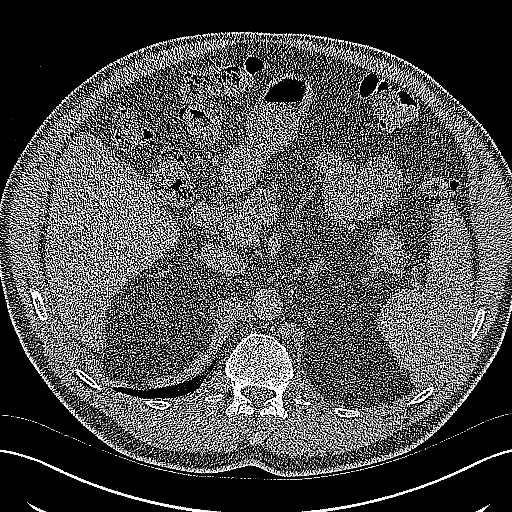
[im 28/327  lung]
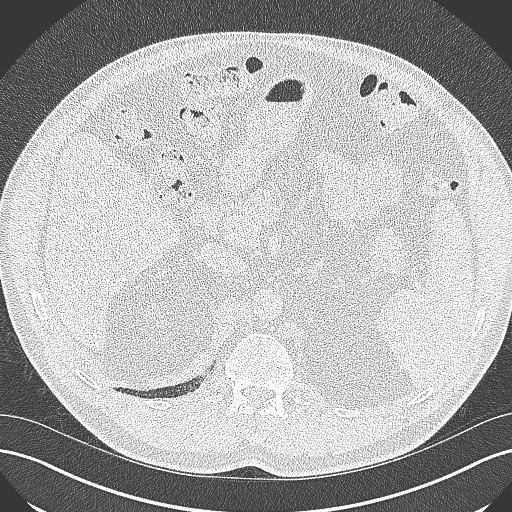
[im 55/327  lung]
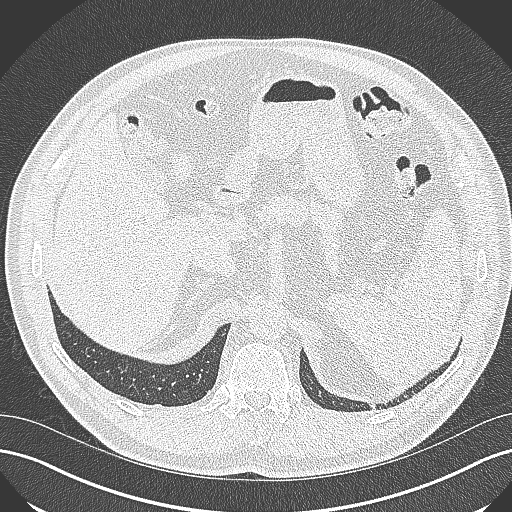
[im 82/327  lung]
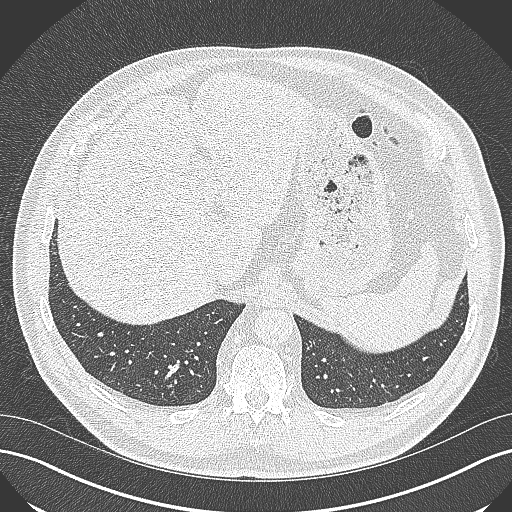
[im 109/327  lung]
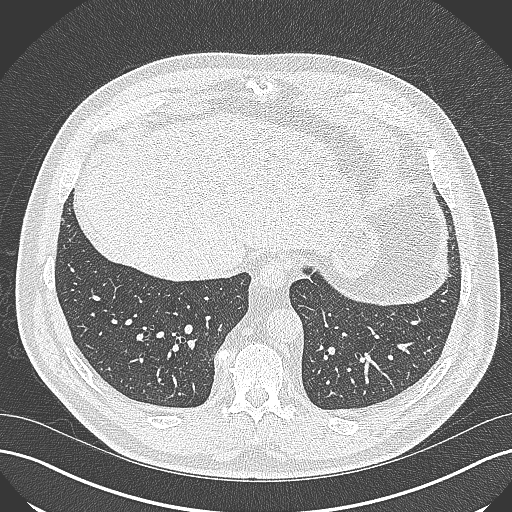
[im 136/327  mediastinal]
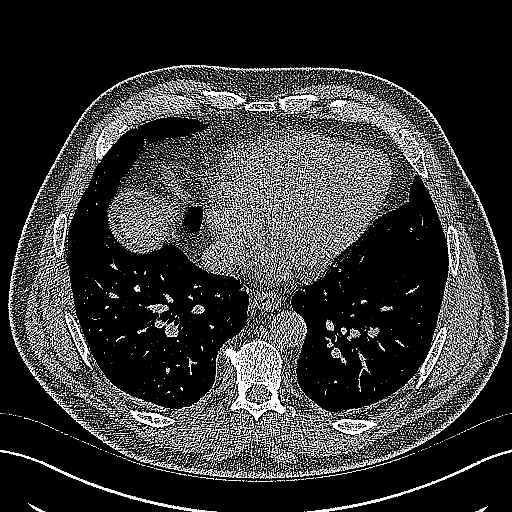
[im 136/327  lung]
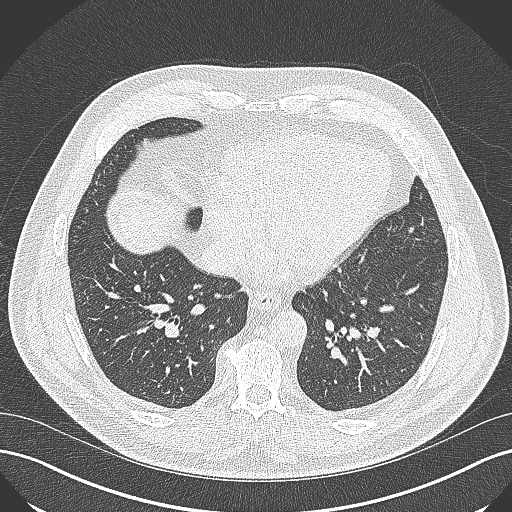
[im 164/327  lung]
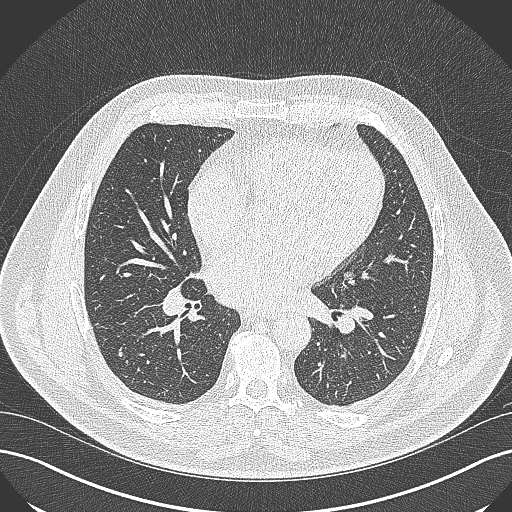
[im 191/327  lung]
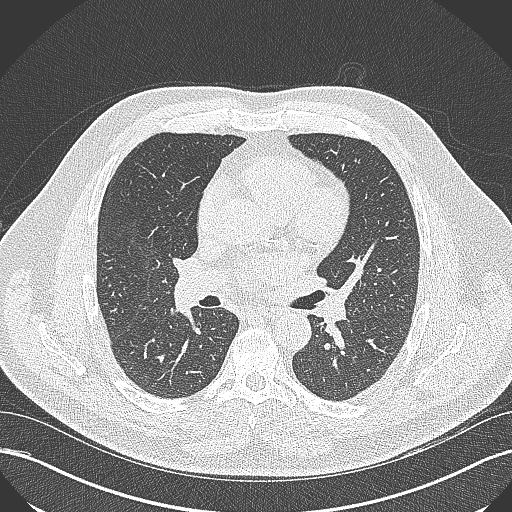
[im 218/327  lung]
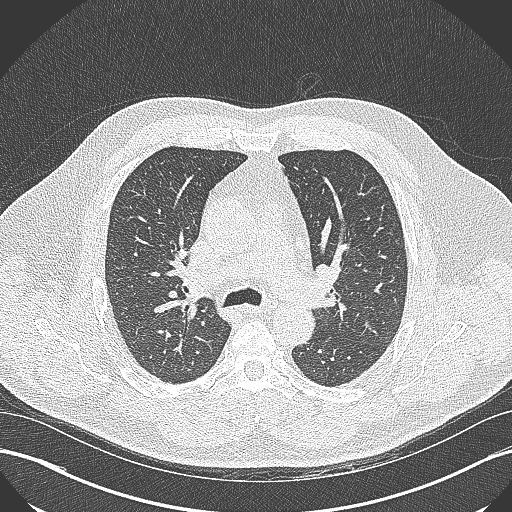
[im 245/327  mediastinal]
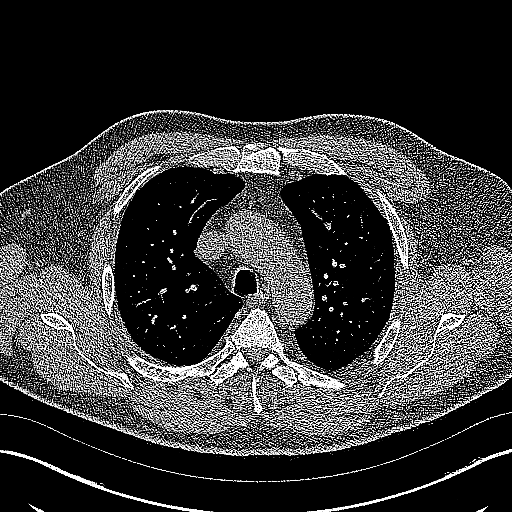
[im 245/327  lung]
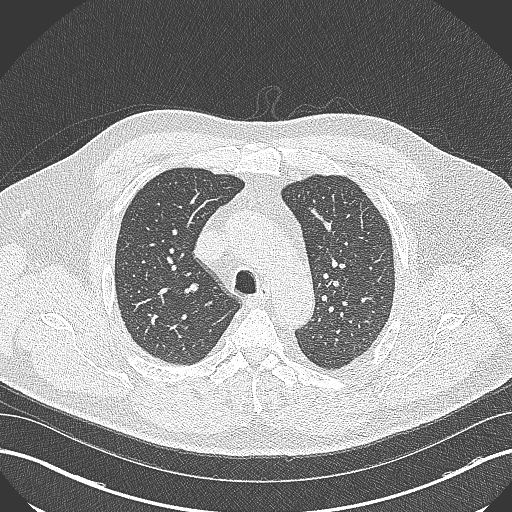
[im 272/327  lung]
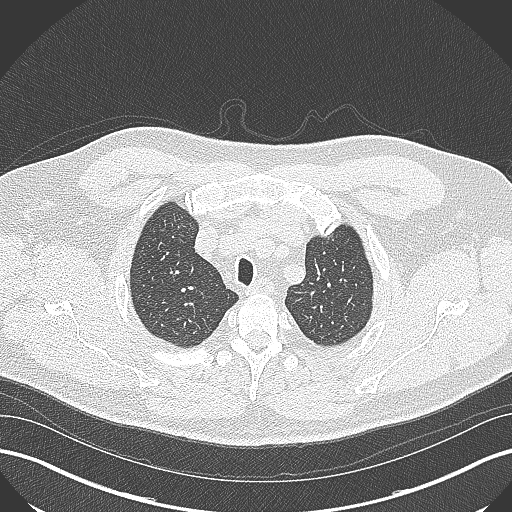
[im 299/327  lung]
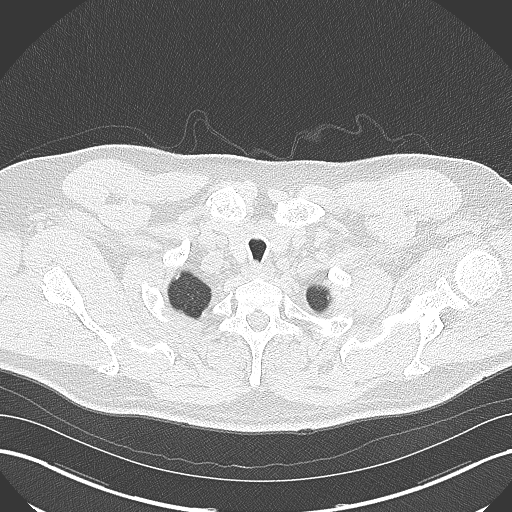

[Series 7: coronal · coronal · 0.64mm/px · 3 of 143 slices shown]
[im 29/143  lung]
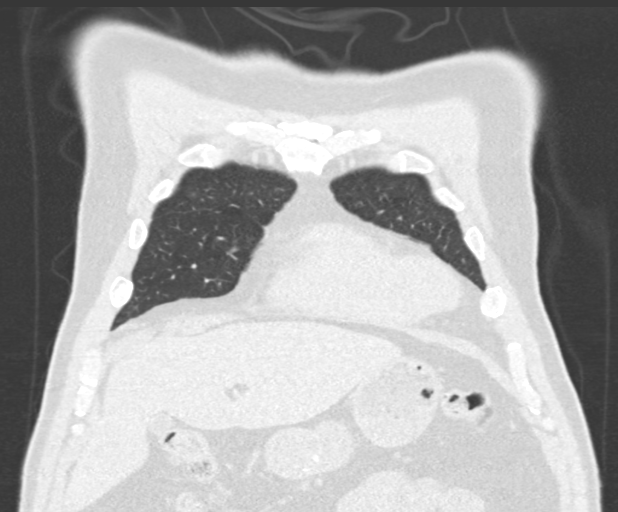
[im 57/143  lung]
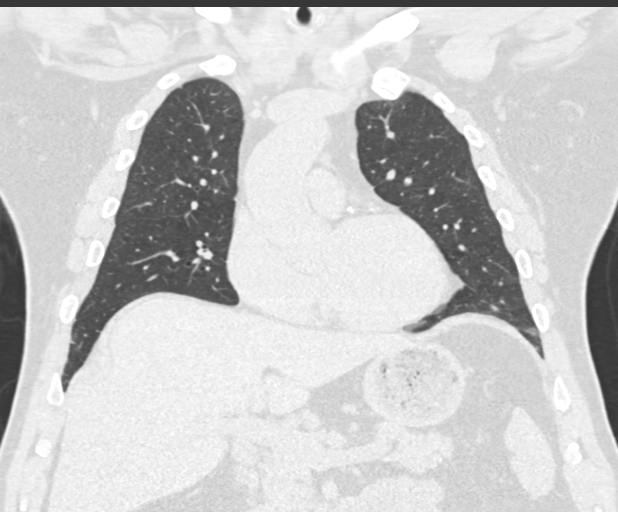
[im 86/143  lung]
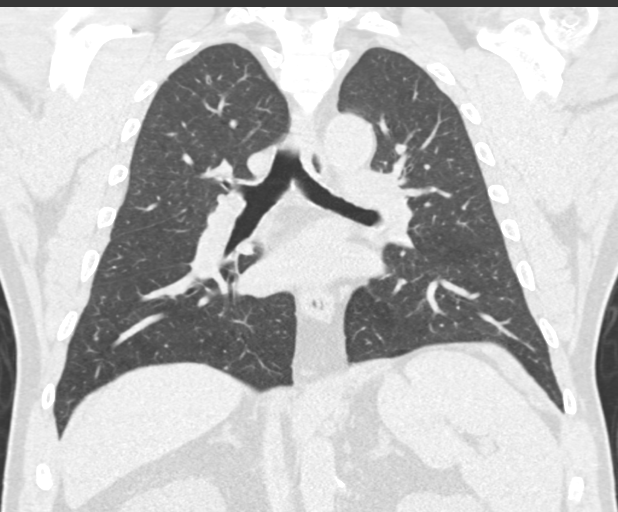

[14 of 36 positions shown; findings below may reference images not displayed]

FINDINGS: Cardiovascular: No significant vascular findings. Normal heart size.
Three-vessel coronary artery calcifications. No pericardial
effusion.

Mediastinum/Nodes: No enlarged mediastinal, hilar, or axillary lymph
nodes. Thyroid gland, trachea, and esophagus demonstrate no
significant findings.

Lungs/Pleura: There are innumerable tiny centrilobular pulmonary
nodules scattered throughout the lungs. Lobular air trapping on
expiratory phase imaging. No pleural effusion or pneumothorax.

Upper Abdomen: No acute abnormality.

Musculoskeletal: No chest wall mass or suspicious bone lesions
identified.
IMPRESSION: 1. There are innumerable tiny centrilobular pulmonary nodules
scattered throughout the lungs. In conjunction with air trapping,
findings are suggestive of hypersensitivity pneumonitis. No evidence
of fibrosis at this time.
2. Coronary artery disease.

## 2022-09-05 ENCOUNTER — Other Ambulatory Visit: Payer: Self-pay | Admitting: Family Medicine

## 2022-09-05 DIAGNOSIS — F5102 Adjustment insomnia: Secondary | ICD-10-CM

## 2022-09-15 ENCOUNTER — Other Ambulatory Visit: Payer: Self-pay | Admitting: Cardiology

## 2022-09-19 ENCOUNTER — Other Ambulatory Visit: Payer: Self-pay

## 2022-09-19 ENCOUNTER — Other Ambulatory Visit: Payer: Self-pay | Admitting: Family Medicine

## 2022-09-19 DIAGNOSIS — F5102 Adjustment insomnia: Secondary | ICD-10-CM

## 2022-10-09 ENCOUNTER — Encounter (HOSPITAL_BASED_OUTPATIENT_CLINIC_OR_DEPARTMENT_OTHER): Payer: Self-pay

## 2022-10-09 ENCOUNTER — Ambulatory Visit (HOSPITAL_BASED_OUTPATIENT_CLINIC_OR_DEPARTMENT_OTHER)
Admission: RE | Admit: 2022-10-09 | Discharge: 2022-10-09 | Disposition: A | Discharge status: Home / Self Care | Payer: HMO | Source: Ambulatory Visit | Attending: Internal Medicine | Admitting: Internal Medicine

## 2022-10-09 DIAGNOSIS — R918 Other nonspecific abnormal finding of lung field: Secondary | ICD-10-CM

## 2022-10-17 ENCOUNTER — Ambulatory Visit: Payer: HMO | Admitting: Internal Medicine

## 2022-11-02 ENCOUNTER — Other Ambulatory Visit: Payer: Self-pay

## 2022-11-02 ENCOUNTER — Telehealth: Payer: Self-pay

## 2022-11-02 ENCOUNTER — Encounter: Payer: Self-pay | Admitting: Family Medicine

## 2022-11-02 ENCOUNTER — Telehealth (INDEPENDENT_AMBULATORY_CARE_PROVIDER_SITE_OTHER): Payer: HMO | Admitting: Family Medicine

## 2022-11-02 VITALS — BP 140/80 | Temp 101.0°F | Wt 222.0 lb

## 2022-11-02 DIAGNOSIS — U071 COVID-19: Secondary | ICD-10-CM | POA: Insufficient documentation

## 2022-11-02 HISTORY — DX: COVID-19: U07.1

## 2022-11-02 MED ORDER — BENZONATATE 200 MG PO CAPS
200.0000 mg | ORAL_CAPSULE | Freq: Three times a day (TID) | ORAL | 0 refills | Status: DC | PRN
Start: 2022-11-02 — End: 2022-11-22

## 2022-11-02 MED ORDER — NIRMATRELVIR/RITONAVIR (PAXLOVID)TABLET
3.0000 | ORAL_TABLET | Freq: Two times a day (BID) | ORAL | 0 refills | Status: AC
Start: 2022-11-02 — End: 2022-11-07

## 2022-11-02 MED ORDER — BENZONATATE 200 MG PO CAPS
200.0000 mg | ORAL_CAPSULE | Freq: Three times a day (TID) | ORAL | 0 refills | Status: DC | PRN
Start: 2022-11-02 — End: 2022-11-02

## 2022-11-02 MED ORDER — NIRMATRELVIR/RITONAVIR (PAXLOVID)TABLET
3.0000 | ORAL_TABLET | Freq: Two times a day (BID) | ORAL | 0 refills | Status: DC
Start: 2022-11-02 — End: 2022-11-02

## 2022-11-02 NOTE — Telephone Encounter (Signed)
Robert Mckinney called this afternoon requesting that the benzonatate (TESSALON) 200 MG capsule and nirmatrelvir/ritonavir (PAXLOVID) 20 x 150 MG & 10 x 100MG  TABS to be sent to a different pharmacy. She would like this sent to [facility] on [address], [place], [place] [address]

## 2022-11-02 NOTE — Assessment & Plan Note (Addendum)
Covid 19 Test - POSITIVE. You have a COVID-19 Infection. You should stay away from people and practice attached CDC guidelines for isolation and masking.  If you start to have leg cramps, stop taking the Lipitor medication until the Paxlovid medication is completed.   If your symptoms are getting worse:  Call the office, inform them of your COVID-19 status and ask for guidance. If you develop severe shortness of breath or difficulty breathing, chest pain, or a high fever that cannot be controlled with medication, go immediately to an emergency department or call 911.

## 2022-11-02 NOTE — Progress Notes (Addendum)
Virtual Visit via Video Note   This visit type was conducted secondary to patient preference OR due to national recommendations for restrictions regarding the COVID-19 Pandemic (e.g. social distancing) in [facility] effort to limit this patient's exposure and mitigate transmission in our community.  Due to his co-morbid illnesses, this patient is at least at moderate risk for complications without adequate follow up.  This format is felt to be most appropriate for this patient at this time.  All issues noted in this document were discussed and addressed.  No physical exam could be performed with this format.  Patient verbally consented to a telehealth visit.   Date:  11/02/2022   ID:  Robert M[facility], DOB [birth year], MRN [id number]  Patient Location: Home Provider Location: Office/Clinic  PCP:  Blane Ohara, MD   History of Present Illness:    Chief Complaint:  Covid positive  Robert Mckinney is a 67 y.o. male with runny nose, cough, sore throat, fever, and diarrhea. Patient denies chest pain, shortness of breath, and dizziness.   The patient does have symptoms concerning for COVID-19 infection (fever, chills, cough, or new shortness of breath).    Past Medical History:  Diagnosis Date   Adjustment insomnia    Benign essential hypertension    Mixed hyperlipidemia    Obstructive sleep apnea (adult) (pediatric)    Other testicular dysfunction     Past Surgical History:  Procedure Laterality Date   APPENDECTOMY     KNEE SURGERY Bilateral    LEFT HEART CATH AND CORONARY ANGIOGRAPHY N/A 03/20/2022   Procedure: LEFT HEART CATH AND CORONARY ANGIOGRAPHY;  Surgeon: Yvonne Kendall, MD;  Location: [facility];  Service: Cardiovascular;  Laterality: N/A;   TONSILLECTOMY      History reviewed. No pertinent family history.  Social History   Socioeconomic History   Marital status: Widowed    Spouse name: Not on file   Number of children: 2   Years of education: Not on file   Highest  education level: Not on file  Occupational History   Occupation: Sales  Tobacco Use   Smoking status: Never   Smokeless tobacco: Never  Substance and Sexual Activity   Alcohol use: Yes    Alcohol/week: 1.0 standard drink of alcohol    Types: 1 Cans of beer per week    Comment: Once a week   Drug use: No   Sexual activity: Yes    Partners: Female  Other Topics Concern   Not on file  Social History Narrative   Lives with girlfriend.  Two sons and 4 grands.  Widow.    Social Determinants of Health   Financial Resource Strain: Low Risk  (11/09/2021)   Overall Financial Resource Strain (CARDIA)    Difficulty of Paying Living Expenses: Not hard at all  Food Insecurity: No Food Insecurity (11/09/2021)   Hunger Vital Sign    Worried About Running Out of Food in the Last Year: Never true    Ran Out of Food in the Last Year: Never true  Transportation Needs: No Transportation Needs (11/09/2021)   PRAPARE - Administrator, Civil Service (Medical): No    Lack of Transportation (Non-Medical): No  Physical Activity: Sufficiently Active (11/09/2021)   Exercise Vital Sign    Days of Exercise per Week: 7 days    Minutes of Exercise per Session: 30 min  Stress: No Stress Concern Present (11/09/2021)   [employer] of Occupational Health - Occupational Stress Questionnaire  Feeling of Stress : Not at all  Social Connections: Moderately Isolated (11/09/2021)   Social Connection and Isolation Panel [NHANES]    Frequency of Communication with Friends and Family: More than three times a week    Frequency of Social Gatherings with Friends and Family: Twice a week    Attends Religious Services: Never    Database administrator or Organizations: No    Attends Banker Meetings: Never    Marital Status: Living with partner  Intimate Partner Violence: Not At Risk (11/09/2021)   Humiliation, Afraid, Rape, and Kick questionnaire    Fear of Current or Ex-Partner: No    Emotionally  Abused: No    Physically Abused: No    Sexually Abused: No    Outpatient Medications Prior to Visit  Medication Sig Dispense Refill   aspirin EC 81 MG tablet Take 1 tablet (81 mg total) by mouth daily. Swallow whole. 30 tablet 11   atorvastatin (LIPITOR) 80 MG tablet Take 1 tablet (80 mg total) by mouth daily. 90 tablet 3   dextromethorphan-guaiFENesin (MUCINEX DM) 30-600 MG 12hr tablet Take 1 tablet by mouth 2 (two) times daily as needed (congestion).     losartan (COZAAR) 100 MG tablet Take 1 tablet (100 mg total) by mouth daily. 90 tablet 3   metoprolol succinate (TOPROL-XL) 50 MG 24 hr tablet Take 1 tablet (50 mg total) by mouth daily. Take with or immediately following a meal. 90 tablet 2   traZODone (DESYREL) 150 MG tablet TAKE ONE TABLET BY MOUTH AT BEDTIME 90 tablet 0   No facility-administered medications prior to visit.    Allergies:   Rosuvastatin calcium   Social History   Tobacco Use   Smoking status: Never   Smokeless tobacco: Never  Substance Use Topics   Alcohol use: Yes    Alcohol/week: 1.0 standard drink of alcohol    Types: 1 Cans of beer per week    Comment: Once a week   Drug use: No     Review of Systems  Constitutional:  Positive for fever and malaise/fatigue.  HENT:  Positive for sore throat.        Runny nose  Respiratory:  Positive for cough. Negative for shortness of breath.   Cardiovascular:  Negative for chest pain.  Gastrointestinal:  Positive for diarrhea.  Musculoskeletal:  Positive for myalgias.  Neurological:  Negative for dizziness.     Labs/Other Tests and Data Reviewed:    Recent Labs: 05/22/2022: ALT 39; BUN 12; Creatinine, Ser 1.17; Hemoglobin 16.0; Platelets 146; Potassium 4.6; Sodium 147; TSH 1.470   Recent Lipid Panel Lab Results  Component Value Date/Time   CHOL 121 05/22/2022 09:33 AM   TRIG 151 (H) 05/22/2022 09:33 AM   HDL 38 (L) 05/22/2022 09:33 AM   CHOLHDL 3.2 05/22/2022 09:33 AM   LDLCALC 57 05/22/2022 09:33 AM    LDLDIRECT 84 04/21/2019 09:56 AM    Wt Readings from Last 3 Encounters:  11/02/22 222 lb (100.7 kg)  06/27/22 230 lb (104.3 kg)  06/12/22 230 lb 3.2 oz (104.4 kg)     Objective:    Vital Signs:  BP (!) 140/80   Temp (!) 101 F (38.3 C)   Wt 222 lb (100.7 kg)   BMI 32.78 kg/m    Physical Exam Constitutional:      General: He is not in acute distress.    Appearance: He is ill-appearing.  Pulmonary:     Effort: Pulmonary effort is normal.  Neurological:     Mental Status: He is alert.  Psychiatric:        Mood and Affect: Mood normal.      ASSESSMENT & PLAN:    COVID-19 Covid 19 Test - POSITIVE. You have a COVID-19 Infection. You should stay away from people and practice attached CDC guidelines for isolation and masking.  If you start to have leg cramps, stop taking the Lipitor medication until the Paxlovid medication is completed.   If your symptoms are getting worse:  Call the office, inform them of your COVID-19 status and ask for guidance. If you develop severe shortness of breath or difficulty breathing, chest pain, or a high fever that cannot be controlled with medication, go immediately to [facility] emergency department or call 911.     No orders of the defined types were placed in this encounter.    Meds ordered this encounter  Medications   nirmatrelvir/ritonavir (PAXLOVID) 20 x 150 MG & 10 x 100MG  TABS    Sig: Take 3 tablets by mouth 2 (two) times daily for 5 days. (Take nirmatrelvir 150 mg two tablets twice daily for 5 days and ritonavir 100 mg one tablet twice daily for 5 days) Patient GFR is 69    Dispense:  30 tablet    Refill:  0   benzonatate (TESSALON) 200 MG capsule    Sig: Take 1 capsule (200 mg total) by mouth 3 (three) times daily as needed for cough.    Dispense:  30 capsule    Refill:  0    I spent 20 minutes dedicated to the care of this patient on the date of this encounter to include face-to-face time with the patient, as well as: reviewing  medications, sending new Prescriptions into pharmacy and educating on CDC guidelines on isolation guidelines.  Follow Up:  In Person prn  Signed,  Renne Crigler, FNP  11/02/2022 3:02 PM    [facility] [place]

## 2022-11-14 ENCOUNTER — Ambulatory Visit: Payer: PPO

## 2022-11-22 ENCOUNTER — Ambulatory Visit (INDEPENDENT_AMBULATORY_CARE_PROVIDER_SITE_OTHER): Payer: HMO | Admitting: Family Medicine

## 2022-11-22 ENCOUNTER — Encounter: Payer: Self-pay | Admitting: Family Medicine

## 2022-11-22 VITALS — BP 110/58 | HR 56 | Temp 96.9°F | Resp 16 | Ht 69.0 in | Wt 229.0 lb

## 2022-11-22 DIAGNOSIS — Z6833 Body mass index (BMI) 33.0-33.9, adult: Secondary | ICD-10-CM | POA: Diagnosis not present

## 2022-11-22 DIAGNOSIS — E782 Mixed hyperlipidemia: Secondary | ICD-10-CM

## 2022-11-22 DIAGNOSIS — I119 Hypertensive heart disease without heart failure: Secondary | ICD-10-CM

## 2022-11-22 DIAGNOSIS — G4733 Obstructive sleep apnea (adult) (pediatric): Secondary | ICD-10-CM

## 2022-11-22 DIAGNOSIS — Z23 Encounter for immunization: Secondary | ICD-10-CM | POA: Diagnosis not present

## 2022-11-22 DIAGNOSIS — E6609 Other obesity due to excess calories: Secondary | ICD-10-CM

## 2022-11-22 LAB — CBC WITH DIFFERENTIAL/PLATELET
Basophils Absolute: 0 10*3/uL (ref 0.0–0.2)
Basos: 1 %
EOS (ABSOLUTE): 0.1 10*3/uL (ref 0.0–0.4)
Eos: 3 %
Hematocrit: 41.9 % (ref 37.5–51.0)
Hemoglobin: 14.1 g/dL (ref 13.0–17.7)
Immature Grans (Abs): 0 10*3/uL (ref 0.0–0.1)
Immature Granulocytes: 0 %
Lymphocytes Absolute: 1.4 10*3/uL (ref 0.7–3.1)
Lymphs: 31 %
MCH: 31.3 pg (ref 26.6–33.0)
MCHC: 33.7 g/dL (ref 31.5–35.7)
MCV: 93 fL (ref 79–97)
Monocytes Absolute: 0.5 10*3/uL (ref 0.1–0.9)
Monocytes: 11 %
Neutrophils Absolute: 2.6 10*3/uL (ref 1.4–7.0)
Neutrophils: 54 %
Platelets: 132 10*3/uL — ABNORMAL LOW (ref 150–450)
RBC: 4.5 x10E6/uL (ref 4.14–5.80)
RDW: 12.8 % (ref 11.6–15.4)
WBC: 4.7 10*3/uL (ref 3.4–10.8)

## 2022-11-22 LAB — COMPREHENSIVE METABOLIC PANEL WITH GFR
ALT: 22 IU/L (ref 0–44)
AST: 17 IU/L (ref 0–40)
Albumin: 4.2 g/dL (ref 3.9–4.9)
Alkaline Phosphatase: 75 IU/L (ref 44–121)
BUN/Creatinine Ratio: 11 (ref 10–24)
BUN: 13 mg/dL (ref 8–27)
Bilirubin Total: 0.8 mg/dL (ref 0.0–1.2)
CO2: 22 mmol/L (ref 20–29)
Calcium: 10.7 mg/dL — ABNORMAL HIGH (ref 8.6–10.2)
Chloride: 107 mmol/L — ABNORMAL HIGH (ref 96–106)
Creatinine, Ser: 1.23 mg/dL (ref 0.76–1.27)
Globulin, Total: 2 g/dL (ref 1.5–4.5)
Glucose: 92 mg/dL (ref 70–99)
Potassium: 4.6 mmol/L (ref 3.5–5.2)
Sodium: 142 mmol/L (ref 134–144)
Total Protein: 6.2 g/dL (ref 6.0–8.5)
eGFR: 64 mL/min/{1.73_m2} (ref >59)

## 2022-11-22 LAB — LIPID PANEL
Chol/HDL Ratio: 3 ratio (ref 0.0–5.0)
Cholesterol, Total: 124 mg/dL (ref 100–199)
HDL: 42 mg/dL (ref >39)
LDL Chol Calc (NIH): 57 mg/dL (ref 0–99)
Triglycerides: 147 mg/dL (ref 0–149)
VLDL Cholesterol Cal: 25 mg/dL (ref 5–40)

## 2022-11-22 NOTE — Assessment & Plan Note (Signed)
Continue cpap. Compliant and benfiting.

## 2022-11-22 NOTE — Assessment & Plan Note (Signed)
Well controlled.  No changes to medicines. Takes Atorvastatin 80 mg daily. Continue to work on eating a healthy diet and exercise.  Labs drawn today.

## 2022-11-22 NOTE — Assessment & Plan Note (Signed)
Well controlled.  No changes to medicines. Takes metoprolol 50 mg daily, Losartan 100 mg daily and aspirin 81 mg daily. Continue to work on eating a healthy diet and exercise.  Labs drawn today.

## 2022-11-22 NOTE — Progress Notes (Signed)
Subjective:  Patient ID: Robert Mckinney, male    DOB: 10-Oct-1955  Age: 67 y.o. MRN: [id number]  Chief Complaint  Patient presents with   Medical Management of Chronic Issues    HPI Hypertensive heart disease: Taking Metoprolol 50 mg daily, Aspirin 81 mg daily, Losartan 100 mg daily.   Eating healthy and exercising. Diet: eggs, banana, No red meat. Eats a lot of chicken. Some [place].   CORONARY ARTERY DISEASE: Sees cardiologist. Taking Atorvastatin and aspirin.  Hyperlipidemia. Atorvastatin 80 mg one before bed.   Using CPAP consistently every night and medically benefiting from its use. Takes trazodone for sleep.     11/22/2022    8:21 AM 05/15/2022    8:22 AM 11/09/2021    1:43 PM 09/20/2020    7:57 AM 06/04/2019    8:17 AM  Depression screen PHQ 2/9  Decreased Interest 0 0 0 0 0  Down, Depressed, Hopeless 0  0 0 0  PHQ - 2 Score 0 0 0 0 0  Altered sleeping 0      Tired, decreased energy 0      Change in appetite 0      Feeling bad or failure about yourself  0      Trouble concentrating 0      Moving slowly or fidgety/restless 0      Suicidal thoughts 0      PHQ-9 Score 0      Difficult doing work/chores Not difficult at all            11/22/2022    8:21 AM  Fall Risk   Falls in the past year? 0  Number falls in past yr: 0  Injury with Fall? 0  Risk for fall due to : No Fall Risks  Follow up Falls evaluation completed;Falls prevention discussed    Patient Care Team: Blane Ohara, MD as PCP - General (Internal Medicine) Rollene Rotunda, MD as PCP - Cardiology (Cardiology)   Review of Systems  Constitutional:  Negative for chills and fever.  HENT:  Positive for rhinorrhea. Negative for congestion, ear pain and sore throat.   Respiratory:  Negative for cough and shortness of breath.   Cardiovascular:  Negative for chest pain and palpitations.  Gastrointestinal:  Negative for abdominal pain, constipation, diarrhea, nausea and vomiting.  Genitourinary:  Negative for  dysuria and urgency.  Musculoskeletal:  Negative for arthralgias, back pain and myalgias.  Neurological:  Negative for dizziness and headaches.  Psychiatric/Behavioral:  Negative for dysphoric mood. The patient is not nervous/anxious.     Current Outpatient Medications on File Prior to Visit  Medication Sig Dispense Refill   aspirin EC 81 MG tablet Take 1 tablet (81 mg total) by mouth daily. Swallow whole. 30 tablet 11   atorvastatin (LIPITOR) 80 MG tablet Take 1 tablet (80 mg total) by mouth daily. 90 tablet 3   losartan (COZAAR) 100 MG tablet Take 1 tablet (100 mg total) by mouth daily. 90 tablet 3   metoprolol succinate (TOPROL-XL) 50 MG 24 hr tablet Take 1 tablet (50 mg total) by mouth daily. Take with or immediately following a meal. 90 tablet 2   traZODone (DESYREL) 150 MG tablet TAKE ONE TABLET BY MOUTH AT BEDTIME 90 tablet 0   No current facility-administered medications on file prior to visit.   Past Medical History:  Diagnosis Date   Adjustment insomnia    Benign essential hypertension    COVID-19 11/02/2022   Mixed hyperlipidemia    Obstructive  sleep apnea (adult) (pediatric)    Other testicular dysfunction    Past Surgical History:  Procedure Laterality Date   APPENDECTOMY     KNEE SURGERY Bilateral    LEFT HEART CATH AND CORONARY ANGIOGRAPHY N/A 03/20/2022   Procedure: LEFT HEART CATH AND CORONARY ANGIOGRAPHY;  Surgeon: Yvonne Kendall, MD;  Location: [facility];  Service: Cardiovascular;  Laterality: N/A;   TONSILLECTOMY      History reviewed. No pertinent family history. Social History   Socioeconomic History   Marital status: Widowed    Spouse name: Not on file   Number of children: 2   Years of education: Not on file   Highest education level: Not on file  Occupational History   Occupation: Sales  Tobacco Use   Smoking status: Never   Smokeless tobacco: Never  Substance and Sexual Activity   Alcohol use: Yes    Alcohol/week: 1.0 standard drink  of alcohol    Types: 1 Cans of beer per week    Comment: Once a week   Drug use: No   Sexual activity: Yes    Partners: Female  Other Topics Concern   Not on file  Social History Narrative   Lives with girlfriend.  Two sons and 4 grands.  Widow.    Social Determinants of Health   Financial Resource Strain: Low Risk  (11/09/2021)   Overall Financial Resource Strain (CARDIA)    Difficulty of Paying Living Expenses: Not hard at all  Food Insecurity: No Food Insecurity (11/09/2021)   Hunger Vital Sign    Worried About Running Out of Food in the Last Year: Never true    Ran Out of Food in the Last Year: Never true  Transportation Needs: No Transportation Needs (11/09/2021)   PRAPARE - Administrator, Civil Service (Medical): No    Lack of Transportation (Non-Medical): No  Physical Activity: Sufficiently Active (11/09/2021)   Exercise Vital Sign    Days of Exercise per Week: 7 days    Minutes of Exercise per Session: 30 min  Stress: No Stress Concern Present (11/09/2021)   [employer] of Occupational Health - Occupational Stress Questionnaire    Feeling of Stress : Not at all  Social Connections: Moderately Isolated (11/09/2021)   Social Connection and Isolation Panel [NHANES]    Frequency of Communication with Friends and Family: More than three times a week    Frequency of Social Gatherings with Friends and Family: Twice a week    Attends Religious Services: Never    Diplomatic Services operational officer: No    Attends Engineer, structural: Never    Marital Status: Living with partner    Objective:  BP (!) 110/58   Pulse (!) 56   Temp (!) 96.9 F (36.1 C)   Resp 16   Ht 5\' 9"  (1.753 m)   Wt 229 lb (103.9 kg)   BMI 33.82 kg/m      11/22/2022    8:16 AM 11/02/2022    2:54 PM 06/27/2022    2:16 PM  BP/Weight  Systolic BP 110 140 118  Diastolic BP 58 80 64  Wt. (Lbs) 229 222 230  BMI 33.82 kg/m2 32.78 kg/m2 33.97 kg/m2    Physical Exam Vitals  reviewed.  Constitutional:      Appearance: Normal appearance. He is obese.  Neck:     Vascular: No carotid bruit.  Cardiovascular:     Rate and Rhythm: Normal rate and regular rhythm.  Heart sounds: Normal heart sounds.  Pulmonary:     Effort: Pulmonary effort is normal.     Breath sounds: Normal breath sounds. No wheezing, rhonchi or rales.  Abdominal:     General: Bowel sounds are normal.     Palpations: Abdomen is soft.     Tenderness: There is no abdominal tenderness.  Neurological:     Mental Status: He is alert and oriented to person, place, and time.  Psychiatric:        Mood and Affect: Mood normal.        Behavior: Behavior normal.     Diabetic Foot Exam - Simple   No data filed      Lab Results  Component Value Date   WBC 4.7 11/22/2022   HGB 14.1 11/22/2022   HCT 41.9 11/22/2022   PLT 132 (L) 11/22/2022   GLUCOSE 92 11/22/2022   CHOL 124 11/22/2022   TRIG 147 11/22/2022   HDL 42 11/22/2022   LDLDIRECT 84 04/21/2019   LDLCALC 57 11/22/2022   ALT 22 11/22/2022   AST 17 11/22/2022   NA 142 11/22/2022   K 4.6 11/22/2022   CL 107 (H) 11/22/2022   CREATININE 1.23 11/22/2022   BUN 13 11/22/2022   CO2 22 11/22/2022   TSH 1.470 05/22/2022   INR 0.98 12/13/2009   HGBA1C 5.3 06/12/2022      Assessment & Plan:    Hypertensive heart disease without heart failure Assessment & Plan: Well controlled.  No changes to medicines. Takes metoprolol 50 mg daily, Losartan 100 mg daily and aspirin 81 mg daily. Continue to work on eating a healthy diet and exercise.  Labs drawn today.     OSA (obstructive sleep apnea) Assessment & Plan: Continue cpap. Compliant and benfiting.   Mixed hyperlipidemia Assessment & Plan: Well controlled.  No changes to medicines. Takes Atorvastatin 80 mg daily. Continue to work on eating a healthy diet and exercise.  Labs drawn today.    Orders: -     CBC with Differential/Platelet -     Comprehensive metabolic panel -      Lipid panel  Encounter for immunization -     Flu Vaccine Trivalent High Dose (Fluad) -     Pfizer Comirnaty Covid-19 Vaccine 58yrs & older  Class 1 obesity due to excess calories with serious comorbidity and body mass index (BMI) of 33.0 to 33.9 in adult Assessment & Plan: Recommend continue to work on eating healthy diet and exercise. Comorbidities: hypertension, hyperlipidemia.      No orders of the defined types were placed in this encounter.   Orders Placed This Encounter  Procedures   Flu Vaccine Trivalent High Dose (Fluad)   Pfizer Comirnaty Covid-19 Vaccine 21yrs & older   CBC with Differential/Platelet   Comprehensive metabolic panel   Lipid panel     Follow-up: Return in about 6 months (around 05/22/2023) for chronic follow up.   I,Marla I Leal-Borjas,acting as a scribe for Blane Ohara, MD.,have documented all relevant documentation on the behalf of Blane Ohara, MD,as directed by  Blane Ohara, MD while in the presence of Blane Ohara, MD.   An After Visit Summary was printed and given to the patient.  Blane Ohara, MD [facility] [phone]

## 2022-11-26 DIAGNOSIS — E6609 Other obesity due to excess calories: Secondary | ICD-10-CM | POA: Insufficient documentation

## 2022-11-26 DIAGNOSIS — Z23 Encounter for immunization: Secondary | ICD-10-CM | POA: Insufficient documentation

## 2022-11-26 NOTE — Assessment & Plan Note (Signed)
Recommend continue to work on eating healthy diet and exercise. Comorbidities: hypertension, hyperlipidemia.

## 2022-11-27 ENCOUNTER — Other Ambulatory Visit: Payer: Self-pay

## 2022-11-27 DIAGNOSIS — R799 Abnormal finding of blood chemistry, unspecified: Secondary | ICD-10-CM

## 2022-11-29 ENCOUNTER — Ambulatory Visit: Payer: HMO | Admitting: Family Medicine

## 2022-12-07 ENCOUNTER — Ambulatory Visit: Payer: HMO | Admitting: Family Medicine

## 2022-12-19 ENCOUNTER — Other Ambulatory Visit: Payer: Self-pay | Admitting: Family Medicine

## 2022-12-19 DIAGNOSIS — F5102 Adjustment insomnia: Secondary | ICD-10-CM

## 2022-12-27 ENCOUNTER — Ambulatory Visit: Payer: HMO

## 2022-12-27 DIAGNOSIS — R799 Abnormal finding of blood chemistry, unspecified: Secondary | ICD-10-CM | POA: Diagnosis not present

## 2022-12-28 LAB — PTH, INTACT AND CALCIUM: PTH: 49 pg/mL (ref 15–65)

## 2022-12-28 LAB — CBC WITH DIFFERENTIAL/PLATELET
Basophils Absolute: 0 10*3/uL (ref 0.0–0.2)
Basos: 1 %
EOS (ABSOLUTE): 0.1 10*3/uL (ref 0.0–0.4)
Eos: 3 %
Hematocrit: 42.1 % (ref 37.5–51.0)
Hemoglobin: 14.2 g/dL (ref 13.0–17.7)
Immature Grans (Abs): 0 10*3/uL (ref 0.0–0.1)
Immature Granulocytes: 0 %
Lymphocytes Absolute: 1.4 10*3/uL (ref 0.7–3.1)
Lymphs: 33 %
MCH: 31.4 pg (ref 26.6–33.0)
MCHC: 33.7 g/dL (ref 31.5–35.7)
MCV: 93 fL (ref 79–97)
Monocytes Absolute: 0.5 10*3/uL (ref 0.1–0.9)
Monocytes: 12 %
Neutrophils Absolute: 2.3 10*3/uL (ref 1.4–7.0)
Neutrophils: 51 %
Platelets: 128 10*3/uL — ABNORMAL LOW (ref 150–450)
RBC: 4.52 x10E6/uL (ref 4.14–5.80)
RDW: 12.6 % (ref 11.6–15.4)
WBC: 4.3 10*3/uL (ref 3.4–10.8)

## 2022-12-28 LAB — COMPREHENSIVE METABOLIC PANEL
ALT: 20 [IU]/L (ref 0–44)
AST: 18 [IU]/L (ref 0–40)
Albumin: 4.5 g/dL (ref 3.9–4.9)
Alkaline Phosphatase: 80 [IU]/L (ref 44–121)
BUN/Creatinine Ratio: 12 (ref 10–24)
BUN: 15 mg/dL (ref 8–27)
Bilirubin Total: 1 mg/dL (ref 0.0–1.2)
CO2: 22 mmol/L (ref 20–29)
Calcium: 10.6 mg/dL — ABNORMAL HIGH (ref 8.6–10.2)
Chloride: 106 mmol/L (ref 96–106)
Creatinine, Ser: 1.29 mg/dL — ABNORMAL HIGH (ref 0.76–1.27)
Globulin, Total: 1.5 g/dL (ref 1.5–4.5)
Glucose: 91 mg/dL (ref 70–99)
Potassium: 4.5 mmol/L (ref 3.5–5.2)
Sodium: 142 mmol/L (ref 134–144)
Total Protein: 6 g/dL (ref 6.0–8.5)
eGFR: 61 mL/min/{1.73_m2} (ref >59)

## 2023-01-01 ENCOUNTER — Encounter: Payer: Self-pay | Admitting: Family Medicine

## 2023-01-02 ENCOUNTER — Other Ambulatory Visit: Payer: Self-pay

## 2023-01-02 DIAGNOSIS — R799 Abnormal finding of blood chemistry, unspecified: Secondary | ICD-10-CM

## 2023-01-02 NOTE — Telephone Encounter (Signed)
Called patient and informed of lab results and scheduled the patient for a 2 week recheck of CBC. Order is in.

## 2023-01-18 ENCOUNTER — Other Ambulatory Visit: Payer: HMO

## 2023-01-18 DIAGNOSIS — R799 Abnormal finding of blood chemistry, unspecified: Secondary | ICD-10-CM

## 2023-01-18 LAB — CBC WITH DIFFERENTIAL/PLATELET
Basophils Absolute: 0 10*3/uL (ref 0.0–0.2)
Basos: 0 %
EOS (ABSOLUTE): 0.2 10*3/uL (ref 0.0–0.4)
Eos: 3 %
Hematocrit: 44.1 % (ref 37.5–51.0)
Hemoglobin: 15.1 g/dL (ref 13.0–17.7)
Immature Grans (Abs): 0 10*3/uL (ref 0.0–0.1)
Immature Granulocytes: 0 %
Lymphocytes Absolute: 1.4 10*3/uL (ref 0.7–3.1)
Lymphs: 28 %
MCH: 31.8 pg (ref 26.6–33.0)
MCHC: 34.2 g/dL (ref 31.5–35.7)
MCV: 93 fL (ref 79–97)
Monocytes Absolute: 0.5 10*3/uL (ref 0.1–0.9)
Monocytes: 9 %
Neutrophils Absolute: 2.9 10*3/uL (ref 1.4–7.0)
Neutrophils: 60 %
Platelets: 150 10*3/uL (ref 150–450)
RBC: 4.75 x10E6/uL (ref 4.14–5.80)
RDW: 13 % (ref 11.6–15.4)
WBC: 5 10*3/uL (ref 3.4–10.8)

## 2023-02-26 ENCOUNTER — Other Ambulatory Visit: Payer: Self-pay

## 2023-02-26 MED ORDER — ATORVASTATIN CALCIUM 80 MG PO TABS: 80.0000 mg | ORAL_TABLET | Freq: Every day | ORAL | 0 refills | Status: DC

## 2023-03-30 ENCOUNTER — Other Ambulatory Visit (HOSPITAL_COMMUNITY): Payer: Self-pay

## 2023-03-31 ENCOUNTER — Other Ambulatory Visit (HOSPITAL_BASED_OUTPATIENT_CLINIC_OR_DEPARTMENT_OTHER): Payer: Self-pay

## 2023-03-31 ENCOUNTER — Other Ambulatory Visit (HOSPITAL_COMMUNITY): Payer: Self-pay

## 2023-04-01 ENCOUNTER — Other Ambulatory Visit (HOSPITAL_COMMUNITY): Payer: Self-pay

## 2023-04-01 MED ORDER — OMEPRAZOLE 40 MG PO CPDR
40.0000 mg | DELAYED_RELEASE_CAPSULE | Freq: Every day | ORAL | 4 refills | Status: DC
  Filled 2023-04-01: qty 30, 30d supply, fill #0
  Filled 2023-05-11: qty 30, 30d supply, fill #1

## 2023-04-01 MED ORDER — ATORVASTATIN CALCIUM 80 MG PO TABS
80.0000 mg | ORAL_TABLET | Freq: Every day | ORAL | 0 refills | Status: DC
  Filled 2023-04-01 – 2023-05-11 (×2): qty 90, 90d supply, fill #0

## 2023-04-01 MED FILL — Trazodone HCl Tab 150 MG: ORAL | 90 days supply | Qty: 90 | Fill #0 | Status: CN

## 2023-04-01 MED FILL — Losartan Potassium Tab 100 MG: ORAL | 90 days supply | Qty: 90 | Fill #0 | Status: CN

## 2023-04-02 ENCOUNTER — Other Ambulatory Visit: Payer: Self-pay

## 2023-04-02 ENCOUNTER — Other Ambulatory Visit (HOSPITAL_COMMUNITY): Payer: Self-pay

## 2023-04-05 ENCOUNTER — Other Ambulatory Visit (HOSPITAL_COMMUNITY): Payer: Self-pay

## 2023-04-06 ENCOUNTER — Other Ambulatory Visit (HOSPITAL_COMMUNITY): Payer: Self-pay

## 2023-05-11 ENCOUNTER — Other Ambulatory Visit: Payer: Self-pay

## 2023-05-11 ENCOUNTER — Other Ambulatory Visit (HOSPITAL_COMMUNITY): Payer: Self-pay

## 2023-05-28 NOTE — Progress Notes (Unsigned)
 Subjective:  Patient ID: Robert Mckinney, male    DOB: 25-Jul-1955  Age: 68 y.o. MRN: [id number]  Chief Complaint  Patient presents with   Medical Management of Chronic Issues   Discussed the use of AI scribe software for clinical note transcription with the patient, who gave verbal consent to proceed.  History of Present Illness   The patient, with a history of hypertension and hyperlipidemia, presents for a routine follow-up. He reports a chronic cough of ten years duration that has recently resolved over the past seven to eight months. The cough was initially thought to be due to lisinopril, which was switched to losartan, leading to significant improvement. However, the cough persisted. The patient was also prescribed omeprazole by an ENT specialist, suspecting reflux as the cause of the cough. The patient took the medication for a month or two but did not notice any improvement in the cough. Currently, the patient does not take omeprazole and reports that the cough has almost completely resolved.  In addition to the resolution of the cough, the patient now experiences nasal drainage. He describes it as a runny nose that occurs intermittently. He has tried allergy medication, which provides some relief. However, the patient does not believe he has allergies as the symptoms are not consistent.  The patient also mentions a history of sleep issues and requests a refill of his sleeping medication.     Hypertensive heart disease: Taking Metoprolol 50 mg daily, Aspirin 81 mg daily, Losartan 100 mg daily.   Eating healthy and exercising. Diet: eggs, banana, No red meat. Eats a lot of chicken. Some [place].   CORONARY ARTERY DISEASE: Sees cardiologist. Taking Atorvastatin and aspirin.  Hyperlipidemia. Atorvastatin 80 mg one before bed.   Using CPAP consistently every night and medically benefiting from its use. Takes trazodone for sleep.  Insomnia: trazodone 150 mg once daily.      05/29/2023     8:22 AM 11/22/2022    8:21 AM 05/15/2022    8:22 AM 11/09/2021    1:43 PM 09/20/2020    7:57 AM  Depression screen PHQ 2/9  Decreased Interest 0 0 0 0 0  Down, Depressed, Hopeless 0 0  0 0  PHQ - 2 Score 0 0 0 0 0  Altered sleeping  0     Tired, decreased energy  0     Change in appetite  0     Feeling bad or failure about yourself   0     Trouble concentrating  0     Moving slowly or fidgety/restless  0     Suicidal thoughts  0     PHQ-9 Score  0     Difficult doing work/chores  Not difficult at all           05/29/2023    8:23 AM  Fall Risk   Falls in the past year? 0  Number falls in past yr: 0  Injury with Fall? 0  Risk for fall due to : No Fall Risks  Follow up Falls evaluation completed    Patient Care Team: Blane Ohara, MD as PCP - General (Internal Medicine) Rollene Rotunda, MD as PCP - Cardiology (Cardiology)   Review of Systems  Constitutional:  Negative for chills, diaphoresis, fatigue and fever.  HENT:  Positive for rhinorrhea. Negative for congestion, ear pain and sore throat.   Respiratory:  Negative for cough and shortness of breath.   Cardiovascular:  Negative for chest pain and leg  swelling.  Gastrointestinal:  Negative for abdominal pain, constipation, diarrhea, nausea and vomiting.  Genitourinary:  Negative for dysuria and urgency.  Musculoskeletal:  Negative for arthralgias and myalgias.  Neurological:  Negative for dizziness and headaches.  Psychiatric/Behavioral:  Negative for dysphoric mood.     Current Outpatient Medications on File Prior to Visit  Medication Sig Dispense Refill   aspirin EC 81 MG tablet Take 1 tablet (81 mg total) by mouth daily. Swallow whole. 30 tablet 11   atorvastatin (LIPITOR) 80 MG tablet Take 1 tablet (80 mg total) by mouth daily. 90 tablet 0   losartan (COZAAR) 100 MG tablet Take 1 tablet (100 mg total) by mouth daily. 90 tablet 1   metoprolol succinate (TOPROL-XL) 50 MG 24 hr tablet Take 1 tablet (50 mg total) by mouth  daily. Take with or immediately following a meal. 90 tablet 2   No current facility-administered medications on file prior to visit.   Past Medical History:  Diagnosis Date   Adjustment insomnia    Benign essential hypertension    COVID-19 11/02/2022   Mixed hyperlipidemia    Obstructive sleep apnea (adult) (pediatric)    Other testicular dysfunction    Past Surgical History:  Procedure Laterality Date   APPENDECTOMY     KNEE SURGERY Bilateral    LEFT HEART CATH AND CORONARY ANGIOGRAPHY N/A 03/20/2022   Procedure: LEFT HEART CATH AND CORONARY ANGIOGRAPHY;  Surgeon: Yvonne Kendall, MD;  Location: [facility];  Service: Cardiovascular;  Laterality: N/A;   TONSILLECTOMY      History reviewed. No pertinent family history. Social History   Socioeconomic History   Marital status: Widowed    Spouse name: Not on file   Number of children: 2   Years of education: Not on file   Highest education level: Not on file  Occupational History   Occupation: Sales  Tobacco Use   Smoking status: Never   Smokeless tobacco: Never  Vaping Use   Vaping status: Never Used  Substance and Sexual Activity   Alcohol use: Yes    Alcohol/week: 1.0 standard drink of alcohol    Types: 1 Cans of beer per week    Comment: Once a week   Drug use: No   Sexual activity: Yes    Partners: Female  Other Topics Concern   Not on file  Social History Narrative   Lives with girlfriend.  Two sons and 4 grands.  Widow.    Social Drivers of Corporate investment banker Strain: Low Risk  (05/29/2023)   Overall Financial Resource Strain (CARDIA)    Difficulty of Paying Living Expenses: Not hard at all  Food Insecurity: No Food Insecurity (05/29/2023)   Hunger Vital Sign    Worried About Running Out of Food in the Last Year: Never true    Ran Out of Food in the Last Year: Never true  Transportation Needs: No Transportation Needs (05/29/2023)   PRAPARE - Administrator, Civil Service  (Medical): No    Lack of Transportation (Non-Medical): No  Physical Activity: Sufficiently Active (05/29/2023)   Exercise Vital Sign    Days of Exercise per Week: 7 days    Minutes of Exercise per Session: 30 min  Stress: No Stress Concern Present (05/29/2023)   [employer] of Occupational Health - Occupational Stress Questionnaire    Feeling of Stress : Not at all  Social Connections: Moderately Isolated (05/29/2023)   Social Connection and Isolation Panel [NHANES]  Frequency of Communication with Friends and Family: More than three times a week    Frequency of Social Gatherings with Friends and Family: Twice a week    Attends Religious Services: Never    Database administrator or Organizations: No    Attends Engineer, structural: Never    Marital Status: Living with partner    Objective:  BP 138/74   Pulse 60   Temp 98 F (36.7 C)   Ht 5\' 9"  (1.753 m)   Wt 234 lb (106.1 kg)   SpO2 98%   BMI 34.56 kg/m      05/29/2023    8:19 AM 11/22/2022    8:16 AM 11/02/2022    2:54 PM  BP/Weight  Systolic BP 138 110 140  Diastolic BP 74 58 80  Wt. (Lbs) 234 229 222  BMI 34.56 kg/m2 33.82 kg/m2 32.78 kg/m2    Physical Exam Vitals reviewed.  Constitutional:      Appearance: Normal appearance. He is obese.  Neck:     Vascular: No carotid bruit.  Cardiovascular:     Rate and Rhythm: Normal rate and regular rhythm.     Pulses: Normal pulses.     Heart sounds: Normal heart sounds.  Pulmonary:     Effort: Pulmonary effort is normal.     Breath sounds: Normal breath sounds. No wheezing, rhonchi or rales.  Abdominal:     General: Bowel sounds are normal.     Palpations: Abdomen is soft.     Tenderness: There is no abdominal tenderness.  Neurological:     Mental Status: He is alert.  Psychiatric:        Mood and Affect: Mood normal.        Behavior: Behavior normal.     Diabetic Foot Exam - Simple   No data filed      Lab Results  Component Value Date    WBC 5.2 05/29/2023   HGB 16.2 05/29/2023   HCT 47.2 05/29/2023   PLT 142 (L) 05/29/2023   GLUCOSE 99 05/29/2023   CHOL 119 05/29/2023   TRIG 113 05/29/2023   HDL 38 (L) 05/29/2023   LDLDIRECT 84 04/21/2019   LDLCALC 60 05/29/2023   ALT 24 05/29/2023   AST 20 05/29/2023   NA 140 05/29/2023   K 4.9 05/29/2023   CL 105 05/29/2023   CREATININE 1.34 (H) 05/29/2023   BUN 12 05/29/2023   CO2 22 05/29/2023   TSH 1.470 05/22/2022   INR 0.98 12/13/2009   HGBA1C 5.3 06/12/2022      Assessment & Plan:   Hypertensive heart disease without heart failure Assessment & Plan: Hypertension is well-managed with metoprolol XL 50 mg once daily and losartan 100 mg once daily. Previously experienced a chronic cough likely due to lisinopril, which resolved after switching to losartan. - Continue metoprolol XL 50 mg once daily - Continue losartan 100 mg once daily  Orders: -     CBC with Differential/Platelet -     Comprehensive metabolic panel  Coronary artery disease involving native coronary artery of native heart without angina pectoris Assessment & Plan: Continue with aspirin and atorvastatin.   OSA (obstructive sleep apnea) Assessment & Plan: Continue cpap. Compliant and benfiting.   Mixed hyperlipidemia Assessment & Plan: Hyperlipidemia is managed with atorvastatin 80 mg once daily. - Continue atorvastatin 80 mg once daily  Orders: -     Lipid panel  Adjustment insomnia Assessment & Plan: The current medical regimen is effective;  continue present plan and medications.  Taking Trazodone 150 mg at bedtime.  Orders: -     traZODone HCl; Take 1 tablet (150 mg total) by mouth at bedtime.  Dispense: 90 tablet; Refill: 1  Gastroesophageal reflux disease without esophagitis Assessment & Plan: Prescribed omeprazole for a chronic cough, suspected to be related to GERD, but the cough resolved after changing antihypertensive medication. No current symptoms of reflux. - Continue  omeprazole as needed   Seasonal allergic rhinitis due to other allergic trigger Assessment & Plan: Experiences nasal drainage and intermittent rhinorrhea, suggestive of allergic rhinitis. Symptoms are managed with allergy medication, which provides some relief. - Continue allergy medication as needed      Meds ordered this encounter  Medications   traZODone (DESYREL) 150 MG tablet    Sig: Take 1 tablet (150 mg total) by mouth at bedtime.    Dispense:  90 tablet    Refill:  1    Orders Placed This Encounter  Procedures   CBC with Differential/Platelet   Comprehensive metabolic panel   Lipid panel     Follow-up: Return in about 6 months (around 11/29/2023) for chronic follow up, awv due in the next month. Clayborn Bigness I Leal-Borjas,acting as a scribe for Blane Ohara, MD.,have documented all relevant documentation on the behalf of Blane Ohara, MD,as directed by  Blane Ohara, MD while in the presence of Blane Ohara, MD.   An After Visit Summary was printed and given to the patient.  I attest that I have reviewed this visit and agree with the plan scribed by my staff.   Blane Ohara, MD [facility] [phone]

## 2023-05-29 ENCOUNTER — Ambulatory Visit: Payer: HMO | Admitting: Family Medicine

## 2023-05-29 ENCOUNTER — Other Ambulatory Visit (HOSPITAL_COMMUNITY): Payer: Self-pay

## 2023-05-29 ENCOUNTER — Encounter: Payer: Self-pay | Admitting: Family Medicine

## 2023-05-29 VITALS — BP 138/74 | HR 60 | Temp 98.0°F | Ht 69.0 in | Wt 234.0 lb

## 2023-05-29 DIAGNOSIS — J3089 Other allergic rhinitis: Secondary | ICD-10-CM

## 2023-05-29 DIAGNOSIS — G4733 Obstructive sleep apnea (adult) (pediatric): Secondary | ICD-10-CM

## 2023-05-29 DIAGNOSIS — F5102 Adjustment insomnia: Secondary | ICD-10-CM

## 2023-05-29 DIAGNOSIS — E782 Mixed hyperlipidemia: Secondary | ICD-10-CM

## 2023-05-29 DIAGNOSIS — I2510 Atherosclerotic heart disease of native coronary artery without angina pectoris: Secondary | ICD-10-CM | POA: Diagnosis not present

## 2023-05-29 DIAGNOSIS — I119 Hypertensive heart disease without heart failure: Secondary | ICD-10-CM

## 2023-05-29 DIAGNOSIS — K219 Gastro-esophageal reflux disease without esophagitis: Secondary | ICD-10-CM | POA: Diagnosis not present

## 2023-05-29 LAB — CBC WITH DIFFERENTIAL/PLATELET
Basophils Absolute: 0 10*3/uL (ref 0.0–0.2)
Basos: 0 %
EOS (ABSOLUTE): 0.1 10*3/uL (ref 0.0–0.4)
Eos: 2 %
Hematocrit: 47.2 % (ref 37.5–51.0)
Hemoglobin: 16.2 g/dL (ref 13.0–17.7)
Immature Grans (Abs): 0 10*3/uL (ref 0.0–0.1)
Immature Granulocytes: 0 %
Lymphocytes Absolute: 1.6 10*3/uL (ref 0.7–3.1)
Lymphs: 31 %
MCH: 32.1 pg (ref 26.6–33.0)
MCHC: 34.3 g/dL (ref 31.5–35.7)
MCV: 94 fL (ref 79–97)
Monocytes Absolute: 0.5 10*3/uL (ref 0.1–0.9)
Monocytes: 9 %
Neutrophils Absolute: 3 10*3/uL (ref 1.4–7.0)
Neutrophils: 58 %
Platelets: 142 10*3/uL — ABNORMAL LOW (ref 150–450)
RBC: 5.05 x10E6/uL (ref 4.14–5.80)
RDW: 13.2 % (ref 11.6–15.4)
WBC: 5.2 10*3/uL (ref 3.4–10.8)

## 2023-05-29 LAB — COMPREHENSIVE METABOLIC PANEL WITH GFR
ALT: 24 [IU]/L (ref 0–44)
AST: 20 [IU]/L (ref 0–40)
Albumin: 4.7 g/dL (ref 3.9–4.9)
Alkaline Phosphatase: 85 [IU]/L (ref 44–121)
BUN/Creatinine Ratio: 9 — ABNORMAL LOW (ref 10–24)
BUN: 12 mg/dL (ref 8–27)
Bilirubin Total: 1.1 mg/dL (ref 0.0–1.2)
CO2: 22 mmol/L (ref 20–29)
Calcium: 10.8 mg/dL — ABNORMAL HIGH (ref 8.6–10.2)
Chloride: 105 mmol/L (ref 96–106)
Creatinine, Ser: 1.34 mg/dL — ABNORMAL HIGH (ref 0.76–1.27)
Globulin, Total: 2 g/dL (ref 1.5–4.5)
Glucose: 99 mg/dL (ref 70–99)
Potassium: 4.9 mmol/L (ref 3.5–5.2)
Sodium: 140 mmol/L (ref 134–144)
Total Protein: 6.7 g/dL (ref 6.0–8.5)
eGFR: 58 mL/min/{1.73_m2} — ABNORMAL LOW

## 2023-05-29 LAB — LIPID PANEL
Chol/HDL Ratio: 3.1 ratio (ref 0.0–5.0)
Cholesterol, Total: 119 mg/dL (ref 100–199)
HDL: 38 mg/dL — ABNORMAL LOW (ref >39)
LDL Chol Calc (NIH): 60 mg/dL (ref 0–99)
Triglycerides: 113 mg/dL (ref 0–149)
VLDL Cholesterol Cal: 21 mg/dL (ref 5–40)

## 2023-05-29 MED ORDER — TRAZODONE HCL 150 MG PO TABS
150.0000 mg | ORAL_TABLET | Freq: Every day | ORAL | 1 refills | Status: DC
Start: 2023-05-29 — End: 2023-12-10
  Filled 2023-05-29 – 2023-06-15 (×2): qty 90, 90d supply, fill #0
  Filled 2023-09-10: qty 90, 90d supply, fill #1

## 2023-05-29 NOTE — Patient Instructions (Signed)
 Recommend continue to work on eating healthy diet and exercise.

## 2023-05-30 ENCOUNTER — Encounter: Payer: Self-pay | Admitting: Family Medicine

## 2023-05-30 DIAGNOSIS — K219 Gastro-esophageal reflux disease without esophagitis: Secondary | ICD-10-CM | POA: Insufficient documentation

## 2023-05-30 DIAGNOSIS — J309 Allergic rhinitis, unspecified: Secondary | ICD-10-CM | POA: Insufficient documentation

## 2023-05-30 NOTE — Assessment & Plan Note (Signed)
 Experiences nasal drainage and intermittent rhinorrhea, suggestive of allergic rhinitis. Symptoms are managed with allergy medication, which provides some relief. - Continue allergy medication as needed

## 2023-05-30 NOTE — Assessment & Plan Note (Signed)
 Hypertension is well-managed with metoprolol XL 50 mg once daily and losartan 100 mg once daily. Previously experienced a chronic cough likely due to lisinopril, which resolved after switching to losartan. - Continue metoprolol XL 50 mg once daily - Continue losartan 100 mg once daily

## 2023-05-30 NOTE — Assessment & Plan Note (Signed)
Continue cpap. Compliant and benfiting.

## 2023-05-30 NOTE — Assessment & Plan Note (Signed)
The current medical regimen is effective;  continue present plan and medications.  Taking Trazodone 150 mg at bedtime.

## 2023-05-30 NOTE — Assessment & Plan Note (Signed)
 Continue with aspirin and atorvastatin.

## 2023-05-30 NOTE — Assessment & Plan Note (Signed)
 Prescribed omeprazole for a chronic cough, suspected to be related to GERD, but the cough resolved after changing antihypertensive medication. No current symptoms of reflux. - Continue omeprazole as needed

## 2023-05-30 NOTE — Assessment & Plan Note (Signed)
 Hyperlipidemia is managed with atorvastatin 80 mg once daily. - Continue atorvastatin 80 mg once daily

## 2023-06-07 ENCOUNTER — Ambulatory Visit: Payer: HMO | Admitting: Cardiology

## 2023-06-15 ENCOUNTER — Other Ambulatory Visit (HOSPITAL_COMMUNITY): Payer: Self-pay

## 2023-06-15 MED FILL — Losartan Potassium Tab 100 MG: ORAL | 90 days supply | Qty: 90 | Fill #0 | Status: AC

## 2023-07-05 ENCOUNTER — Other Ambulatory Visit (HOSPITAL_COMMUNITY): Payer: Self-pay

## 2023-07-05 ENCOUNTER — Ambulatory Visit

## 2023-07-05 VITALS — Ht 69.0 in | Wt 234.0 lb

## 2023-07-05 DIAGNOSIS — Z0000 Encounter for general adult medical examination without abnormal findings: Secondary | ICD-10-CM

## 2023-07-05 MED FILL — Losartan Potassium Tab 100 MG: ORAL | 90 days supply | Qty: 90 | Fill #1 | Status: CN

## 2023-07-05 NOTE — Patient Instructions (Signed)
 Robert Mckinney , Thank you for taking time to come for your Medicare Wellness Visit. I appreciate your ongoing commitment to your health goals. Please review the following plan we discussed and let me know if I can assist you in the future.   Referrals/Orders/Follow-Ups/Clinician Recommendations: Aim for 30 minutes of exercise or brisk walking, 6-8 glasses of water, and 5 servings of fruits and vegetables each day.  This is a list of the screening recommended for you and due dates:  Health Maintenance  Topic Date Due   COVID-19 Vaccine (6 - 2024-25 season) 05/22/2023   Flu Shot  10/05/2023   Medicare Annual Wellness Visit  07/04/2024   Cologuard (Stool DNA test)  12/16/2024   DTaP/Tdap/Td vaccine (2 - Td or Tdap) 11/26/2029   Pneumonia Vaccine  Completed   Hepatitis C Screening  Completed   Zoster (Shingles) Vaccine  Completed   HPV Vaccine  Aged Out   Meningitis B Vaccine  Aged Out    Advanced directives: (ACP Link)Information on Advanced Care Planning can be found at [employer] [facility] Advance Health Care Directives Advance Health Care Directives. [email or web address]   Next Medicare Annual Wellness Visit scheduled for next year: Yes

## 2023-07-05 NOTE — Progress Notes (Signed)
 Subjective:   Robert Mckinney is a 68 y.o. who presents for a Medicare Wellness preventive visit.  Visit Complete: Virtual I connected with  Robert Mckinney on 07/05/23 by a audio enabled telemedicine application and verified that I am speaking with the correct person using two identifiers.  Patient Location: Home  Provider Location: Home Office  I discussed the limitations of evaluation and management by telemedicine. The patient expressed understanding and agreed to proceed.  Vital Signs: Because this visit was a virtual/telehealth visit, some criteria may be missing or patient reported. Any vitals not documented were not able to be obtained and vitals that have been documented are patient reported.  VideoDeclined- This patient declined Librarian, academic. Therefore the visit was completed with audio only.  Persons Participating in Visit: Patient.  AWV Questionnaire: No: Patient Medicare AWV questionnaire was not completed prior to this visit.  Cardiac Risk Factors include: advanced age (>21men, >46 women)     Objective:    Today's Vitals   07/05/23 1354  Weight: 234 lb (106.1 kg)  Height: 5\' 9"  (1.753 m)   Body mass index is 34.56 kg/m.     07/05/2023    1:59 PM 03/20/2022    6:18 AM 11/09/2021    1:51 PM  Advanced Directives  Does Patient Have a Medical Advance Directive? No Yes No  Type of Special educational needs teacher of [place];Living will   Does patient want to make changes to medical advance directive?  No - Patient declined   Copy of Healthcare Power of Attorney in Chart?  No - copy requested   Would patient like information on creating a medical advance directive? Yes (MAU/Ambulatory/Procedural Areas - Information given)  Yes (Inpatient - patient defers creating a medical advance directive at this time - Information given)    Current Medications (verified) Outpatient Encounter Medications as of 07/05/2023  Medication Sig   aspirin  EC  81 MG tablet Take 1 tablet (81 mg total) by mouth daily. Swallow whole.   atorvastatin  (LIPITOR) 80 MG tablet Take 1 tablet (80 mg total) by mouth daily.   losartan  (COZAAR ) 100 MG tablet Take 1 tablet (100 mg total) by mouth daily.   metoprolol  succinate (TOPROL -XL) 50 MG 24 hr tablet Take 1 tablet (50 mg total) by mouth daily. Take with or immediately following a meal.   traZODone  (DESYREL ) 150 MG tablet Take 1 tablet (150 mg total) by mouth at bedtime.   No facility-administered encounter medications on file as of 07/05/2023.    Allergies (verified) Ace inhibitors and Rosuvastatin  calcium    History: Past Medical History:  Diagnosis Date   Adjustment insomnia    Benign essential hypertension    COVID-19 11/02/2022   Mixed hyperlipidemia    Obstructive sleep apnea (adult) (pediatric)    Other testicular dysfunction    Past Surgical History:  Procedure Laterality Date   APPENDECTOMY     KNEE SURGERY Bilateral    LEFT HEART CATH AND CORONARY ANGIOGRAPHY N/A 03/20/2022   Procedure: LEFT HEART CATH AND CORONARY ANGIOGRAPHY;  Surgeon: Sammy Crisp, MD;  Location: [facility];  Service: Cardiovascular;  Laterality: N/A;   TONSILLECTOMY     History reviewed. No pertinent family history. Social History   Socioeconomic History   Marital status: Widowed    Spouse name: Not on file   Number of children: 2   Years of education: Not on file   Highest education level: Not on file  Occupational History  Occupation: Airline pilot  Tobacco Use   Smoking status: Never   Smokeless tobacco: Never  Vaping Use   Vaping status: Never Used  Substance and Sexual Activity   Alcohol use: Yes    Alcohol/week: 1.0 standard drink of alcohol    Types: 1 Cans of beer per week    Comment: Once a week   Drug use: No   Sexual activity: Yes    Partners: Female  Other Topics Concern   Not on file  Social History Narrative   Lives with girlfriend.  Two sons and 4 grands.  Widow.    Social  Drivers of Corporate investment banker Strain: Low Risk  (07/05/2023)   Overall Financial Resource Strain (CARDIA)    Difficulty of Paying Living Expenses: Not hard at all  Food Insecurity: No Food Insecurity (07/05/2023)   Hunger Vital Sign    Worried About Running Out of Food in the Last Year: Never true    Ran Out of Food in the Last Year: Never true  Transportation Needs: No Transportation Needs (07/05/2023)   PRAPARE - Administrator, Civil Service (Medical): No    Lack of Transportation (Non-Medical): No  Physical Activity: Sufficiently Active (07/05/2023)   Exercise Vital Sign    Days of Exercise per Week: 7 days    Minutes of Exercise per Session: 30 min  Stress: No Stress Concern Present (07/05/2023)   [employer] of Occupational Health - Occupational Stress Questionnaire    Feeling of Stress : Not at all  Social Connections: Moderately Isolated (07/05/2023)   Social Connection and Isolation Panel [NHANES]    Frequency of Communication with Friends and Family: More than three times a week    Frequency of Social Gatherings with Friends and Family: Twice a week    Attends Religious Services: Never    Database administrator or Organizations: No    Attends Engineer, structural: Never    Marital Status: Living with partner    Tobacco Counseling Counseling given: Not Answered    Clinical Intake:  Pre-visit preparation completed: Yes  Pain : No/denies pain     Diabetes: No  Lab Results  Component Value Date   HGBA1C 5.3 06/12/2022     How often do you need to have someone help you when you read instructions, pamphlets, or other written materials from your doctor or pharmacy?: 1 - Never  Interpreter Needed?: No  Information entered by :: Seabron Cypress LPN   Activities of Daily Living     07/05/2023    1:55 PM 11/27/2022    5:44 AM  In your present state of health, do you have any difficulty performing the following activities:  Hearing? 0 0   Vision? 0 0  Difficulty concentrating or making decisions? 0 0  Walking or climbing stairs? 0 0  Dressing or bathing? 0 0  Doing errands, shopping? 0 0  Preparing Food and eating ? N N  Using the Toilet? N N  In the past six months, have you accidently leaked urine? N N  Do you have problems with loss of bowel control? N N  Managing your Medications? N N  Managing your Finances? N N  Housekeeping or managing your Housekeeping? N N    Patient Care Team: Mercy Stall, MD as PCP - General (Internal Medicine) Eilleen Grates, MD as PCP - Cardiology (Cardiology) [facility], [place]  Indicate any recent Medical Services you may have received from other  than [facility] providers in the past year (date may be approximate).     Assessment:   This is a routine wellness examination for Jung.  Hearing/Vision screen Hearing Screening - Comments:: Denies hearing difficulties   Vision Screening - Comments:: Wears rx glasses - up to date with routine eye exams with [facility]    Goals Addressed             This Visit's Progress    Remain active and independent         Depression Screen     07/05/2023    1:58 PM 05/29/2023    8:22 AM 11/22/2022    8:21 AM 05/15/2022    8:22 AM 11/09/2021    1:43 PM 09/20/2020    7:57 AM 06/04/2019    8:17 AM  PHQ 2/9 Scores  PHQ - 2 Score 0 0 0 0 0 0 0  PHQ- 9 Score   0        Fall Risk     07/05/2023    1:59 PM 05/29/2023    8:23 AM 11/27/2022    5:44 AM 11/22/2022    8:21 AM 05/15/2022    8:22 AM  Fall Risk   Falls in the past year? 0 0 0 0 0  Number falls in past yr: 0 0 0 0 0  Injury with Fall? 0 0 0 0 0  Risk for fall due to : No Fall Risks No Fall Risks  No Fall Risks No Fall Risks  Follow up Falls prevention discussed;Education provided;Falls evaluation completed Falls evaluation completed  Falls evaluation completed;Falls prevention discussed Falls evaluation completed    MEDICARE RISK AT HOME:  Medicare Risk at Home Any stairs in or  around the home?: No If so, are there any without handrails?: No Home free of loose throw rugs in walkways, pet beds, electrical cords, etc?: Yes Adequate lighting in your home to reduce risk of falls?: Yes Life alert?: No Use of a cane, walker or w/c?: No Grab bars in the bathroom?: Yes Shower chair or bench in shower?: Yes Elevated toilet seat or a handicapped toilet?: Yes  TIMED UP AND GO:  Was the test performed?  No  Cognitive Function: 6CIT completed        07/05/2023    1:59 PM  6CIT Screen  What Year? 0 points  What month? 0 points  What time? 0 points  Count back from 20 0 points  Months in reverse 0 points  Repeat phrase 0 points  Total Score 0 points    Immunizations Immunization History  Administered Date(s) Administered   Fluad Quad(high Dose 65+) 12/12/2018, 11/24/2020   Fluad Trivalent(High Dose 65+) 11/22/2022   Influenza Inj Mdck Quad Pf 11/13/2019   Influenza-Unspecified 12/18/2021   Moderna Sars-Covid-2 Vaccination 06/23/2020   PFIZER(Purple Top)SARS-COV-2 Vaccination 04/14/2019, 05/09/2019, 12/04/2019   PNEUMOCOCCAL CONJUGATE-20 11/09/2021   Pfizer(Comirnaty)Fall Seasonal Vaccine 12 years and older 11/22/2022   Tdap 11/27/2019   Zoster Recombinant(Shingrix ) 11/27/2019, 01/27/2020    Screening Tests Health Maintenance  Topic Date Due   COVID-19 Vaccine (6 - 2024-25 season) 05/22/2023   INFLUENZA VACCINE  10/05/2023   Medicare Annual Wellness (AWV)  07/04/2024   Fecal DNA (Cologuard)  12/16/2024   DTaP/Tdap/Td (2 - Td or Tdap) 11/26/2029   Pneumonia Vaccine 62+ Years old  Completed   Hepatitis C Screening  Completed   Zoster Vaccines- Shingrix   Completed   HPV VACCINES  Aged Out   Meningococcal B Vaccine  Aged Out    Health Maintenance  Health Maintenance Due  Topic Date Due   COVID-19 Vaccine (6 - 2024-25 season) 05/22/2023    Additional Screening:  Vision Screening: Recommended annual ophthalmology exams for early detection of  glaucoma and other disorders of the eye.  Dental Screening: Recommended annual dental exams for proper oral hygiene  Community Resource Referral / Chronic Care Management: CRR required this visit?  No   CCM required this visit?  No     Plan:     I have personally reviewed and noted the following in the patient's chart:   Medical and social history Use of alcohol, tobacco or illicit drugs  Current medications and supplements including opioid prescriptions. Patient is not currently taking opioid prescriptions. Functional ability and status Nutritional status Physical activity Advanced directives List of other physicians Hospitalizations, surgeries, and ER visits in previous 12 months Vitals Screenings to include cognitive, depression, and falls Referrals and appointments  In addition, I have reviewed and discussed with patient certain preventive protocols, quality metrics, and best practice recommendations. A written personalized care plan for preventive services as well as general preventive health recommendations were provided to patient.     Seabron Cypress [place], [place]   04/06/3084   After Visit Summary: (MyChart) Due to this being a telephonic visit, the after visit summary with patients personalized plan was offered to patient via MyChart   Notes: Nothing significant to report at this time.

## 2023-07-12 ENCOUNTER — Other Ambulatory Visit

## 2023-07-12 DIAGNOSIS — N289 Disorder of kidney and ureter, unspecified: Secondary | ICD-10-CM

## 2023-07-13 LAB — CMP14+EGFR
ALT: 27 IU/L (ref 0–44)
AST: 19 IU/L (ref 0–40)
Albumin: 4.5 g/dL (ref 3.9–4.9)
Alkaline Phosphatase: 87 IU/L (ref 44–121)
BUN/Creatinine Ratio: 10 (ref 10–24)
BUN: 12 mg/dL (ref 8–27)
Bilirubin Total: 0.8 mg/dL (ref 0.0–1.2)
CO2: 19 mmol/L — ABNORMAL LOW (ref 20–29)
Calcium: 10.4 mg/dL — ABNORMAL HIGH (ref 8.6–10.2)
Chloride: 108 mmol/L — ABNORMAL HIGH (ref 96–106)
Creatinine, Ser: 1.22 mg/dL (ref 0.76–1.27)
Globulin, Total: 1.9 g/dL (ref 1.5–4.5)
Glucose: 100 mg/dL — ABNORMAL HIGH (ref 70–99)
Potassium: 4.1 mmol/L (ref 3.5–5.2)
Sodium: 141 mmol/L (ref 134–144)
Total Protein: 6.4 g/dL (ref 6.0–8.5)
eGFR: 65 mL/min/{1.73_m2} (ref >59)

## 2023-07-15 ENCOUNTER — Encounter: Payer: Self-pay | Admitting: Family Medicine

## 2023-07-16 ENCOUNTER — Other Ambulatory Visit (HOSPITAL_COMMUNITY): Payer: Self-pay

## 2023-07-16 MED FILL — Losartan Potassium Tab 100 MG: ORAL | 90 days supply | Qty: 90 | Fill #1 | Status: CN

## 2023-07-26 DIAGNOSIS — I10 Essential (primary) hypertension: Secondary | ICD-10-CM | POA: Insufficient documentation

## 2023-07-26 DIAGNOSIS — E785 Hyperlipidemia, unspecified: Secondary | ICD-10-CM | POA: Insufficient documentation

## 2023-07-26 NOTE — Progress Notes (Signed)
 Cardiology Office Note:   Date:  07/27/2023  ID:  Robert Mckinney, DOB August 21, 1955, MRN [id number] PCP: Mercy Stall, MD  [facility] Providers Cardiologist:  Eilleen Grates, MD {  History of Present Illness:   Robert Mckinney is a 68 y.o. male who presents for evaluation of CAD.  He was noted to have coronary calcium  on a recent CT.  I sent him for a POET (Plain Old Exercise Treadmill) which was equivocal.  Coronary CT demonstrated LAD proximal stenosis that was said to be 70 to 99%, intermediate 25 to 49% stenosis, D2 50 to 69% stenosis and mild plaque elsewhere.  His calcium  score was 937.  However, his FFR was positive only in the distal vessel.  He had gone greater than 9 minutes on the treadmill without ST changes.  He had no symptoms.  We had a long conversation it was decided to manage him medically with optimal medical therapy.  I did review his films with some of my partners.   At the last visit I sent him for cath and he was found to have non obstructive and small vessel disease.   Since I saw him he denies any new symptoms.  He has not been exercising as much as I would like.  The patient denies any new symptoms such as chest discomfort, neck or arm discomfort. There has been no new shortness of breath, PND or orthopnea. There have been no reported palpitations, presyncope or syncope.   ROS: As stated in the HPI and negative for all other systems.  Studies Reviewed:    EKG:   EKG Interpretation Date/Time:  Friday Jul 27 2023 15:54:42 EDT Ventricular Rate:  51 PR Interval:  198 QRS Duration:  96 QT Interval:  420 QTC Calculation: 387 R Axis:   9  Text Interpretation: Sinus bradycardia Poor anterior R wave progression When compared with ECG of 13-Dec-2009 13:41, No significant change was found Confirmed by Eilleen Grates ([id number]) on 07/27/2023 4:06:14 PM    Risk Assessment/Calculations:              Physical Exam:   VS:  BP 124/80   Pulse (!) 54   Ht 5' 9.5" (1.765 m)    Wt 239 lb 6.4 oz (108.6 kg)   SpO2 98%   BMI 34.85 kg/m    Wt Readings from Last 3 Encounters:  07/27/23 239 lb 6.4 oz (108.6 kg)  07/05/23 234 lb (106.1 kg)  05/29/23 234 lb (106.1 kg)     GEN: Well nourished, well developed in no acute distress NECK: No JVD; No carotid bruits CARDIAC: RRR, no murmurs, rubs, gallops RESPIRATORY:  Clear to auscultation without rales, wheezing or rhonchi  ABDOMEN: Soft, non-tender, non-distended EXTREMITIES:  No edema; No deformity   ASSESSMENT AND PLAN:   CAD   The patient has no new sypmtoms.  No further cardiovascular testing is indicated.  We will continue with aggressive risk reduction and meds as listed.=  DYSLIPIDEMIA: LDL goal was 60 with an HDL of 38.  No change in therapy.  We talked again about diet.  HTN:    BP is at target.  No change in therapy.  OBESTIY:   He is not exercising as much as he was when he was losing weight.  I encouraged him to get back on the elliptical that he has and encouraged continued weight loss with diet and exercise.  Follow up with me in 1 year.  Signed, Eilleen Grates, MD

## 2023-07-27 ENCOUNTER — Encounter: Payer: Self-pay | Admitting: Cardiology

## 2023-07-27 ENCOUNTER — Ambulatory Visit: Attending: Cardiology | Admitting: Cardiology

## 2023-07-27 ENCOUNTER — Other Ambulatory Visit (HOSPITAL_COMMUNITY): Payer: Self-pay

## 2023-07-27 VITALS — BP 124/80 | HR 54 | Ht 69.5 in | Wt 239.4 lb

## 2023-07-27 DIAGNOSIS — I2510 Atherosclerotic heart disease of native coronary artery without angina pectoris: Secondary | ICD-10-CM | POA: Diagnosis not present

## 2023-07-27 DIAGNOSIS — E785 Hyperlipidemia, unspecified: Secondary | ICD-10-CM

## 2023-07-27 DIAGNOSIS — I10 Essential (primary) hypertension: Secondary | ICD-10-CM

## 2023-07-27 MED ORDER — METOPROLOL SUCCINATE ER 50 MG PO TB24
50.0000 mg | ORAL_TABLET | Freq: Every day | ORAL | 3 refills | Status: AC
  Filled 2023-07-27: qty 90, 90d supply, fill #0
  Filled 2023-09-10 – 2023-11-07 (×2): qty 90, 90d supply, fill #1
  Filled 2024-01-08 – 2024-01-24 (×3): qty 90, 90d supply, fill #2

## 2023-07-27 NOTE — Patient Instructions (Signed)
 Medication Instructions:  The current medical regimen is effective;  continue present plan and medications.  *If you need a refill on your cardiac medications before your next appointment, please call your pharmacy*  Follow-Up: At [facility], you and your health needs are our priority.  As part of our continuing mission to provide you with exceptional heart care, our providers are all part of one team.  This team includes your primary Cardiologist (physician) and Advanced Practice Providers or APPs (Physician Assistants and Nurse Practitioners) who all work together to provide you with the care you need, when you need it.  Your next appointment:   1 year(s)  Provider:   Rollene Rotunda, MD    We recommend signing up for the patient portal called "MyChart".  Sign up information is provided on this After Visit Summary.  MyChart is used to connect with patients for Virtual Visits (Telemedicine).  Patients are able to view lab/test results, encounter notes, upcoming appointments, etc.  Non-urgent messages can be sent to your provider as well.   To learn more about what you can do with MyChart, go to [email or web address].

## 2023-08-16 ENCOUNTER — Other Ambulatory Visit (HOSPITAL_COMMUNITY): Payer: Self-pay

## 2023-08-16 DIAGNOSIS — M25562 Pain in left knee: Secondary | ICD-10-CM | POA: Diagnosis not present

## 2023-08-16 MED ORDER — PREDNISONE 5 MG (21) PO TBPK
5.0000 mg | ORAL_TABLET | ORAL | 0 refills | Status: DC
  Filled 2023-08-16: qty 21, 6d supply, fill #0

## 2023-08-17 ENCOUNTER — Other Ambulatory Visit: Payer: Self-pay

## 2023-08-17 ENCOUNTER — Other Ambulatory Visit (HOSPITAL_COMMUNITY): Payer: Self-pay

## 2023-08-23 ENCOUNTER — Other Ambulatory Visit: Payer: Self-pay

## 2023-08-23 ENCOUNTER — Encounter (HOSPITAL_COMMUNITY): Payer: Self-pay

## 2023-08-23 ENCOUNTER — Observation Stay (HOSPITAL_COMMUNITY)
Admission: EM | Admit: 2023-08-23 | Discharge: 2023-08-24 | Disposition: A | Discharge status: Home / Self Care | Attending: Family Medicine | Admitting: Family Medicine

## 2023-08-23 DIAGNOSIS — T63461A Toxic effect of venom of wasps, accidental (unintentional), initial encounter: Secondary | ICD-10-CM | POA: Diagnosis present

## 2023-08-23 DIAGNOSIS — G4700 Insomnia, unspecified: Secondary | ICD-10-CM | POA: Insufficient documentation

## 2023-08-23 DIAGNOSIS — T782XXA Anaphylactic shock, unspecified, initial encounter: Principal | ICD-10-CM | POA: Diagnosis present

## 2023-08-23 DIAGNOSIS — E785 Hyperlipidemia, unspecified: Secondary | ICD-10-CM | POA: Diagnosis present

## 2023-08-23 DIAGNOSIS — K219 Gastro-esophageal reflux disease without esophagitis: Secondary | ICD-10-CM | POA: Diagnosis present

## 2023-08-23 DIAGNOSIS — T7840XA Allergy, unspecified, initial encounter: Secondary | ICD-10-CM

## 2023-08-23 DIAGNOSIS — E66812 Obesity, class 2: Secondary | ICD-10-CM

## 2023-08-23 DIAGNOSIS — R001 Bradycardia, unspecified: Secondary | ICD-10-CM | POA: Diagnosis not present

## 2023-08-23 DIAGNOSIS — Z7982 Long term (current) use of aspirin: Secondary | ICD-10-CM | POA: Diagnosis not present

## 2023-08-23 DIAGNOSIS — Z8616 Personal history of COVID-19: Secondary | ICD-10-CM | POA: Insufficient documentation

## 2023-08-23 DIAGNOSIS — I10 Essential (primary) hypertension: Secondary | ICD-10-CM | POA: Diagnosis present

## 2023-08-23 DIAGNOSIS — G4733 Obstructive sleep apnea (adult) (pediatric): Secondary | ICD-10-CM | POA: Diagnosis present

## 2023-08-23 DIAGNOSIS — L299 Pruritus, unspecified: Secondary | ICD-10-CM | POA: Diagnosis present

## 2023-08-23 DIAGNOSIS — R220 Localized swelling, mass and lump, head: Secondary | ICD-10-CM | POA: Diagnosis not present

## 2023-08-23 DIAGNOSIS — Z79899 Other long term (current) drug therapy: Secondary | ICD-10-CM | POA: Insufficient documentation

## 2023-08-23 LAB — CBC WITH DIFFERENTIAL/PLATELET
Abs Immature Granulocytes: 0.05 10*3/uL (ref 0.00–0.07)
Basophils Absolute: 0 10*3/uL (ref 0.0–0.1)
Basophils Relative: 0 %
Eosinophils Absolute: 0.2 10*3/uL (ref 0.0–0.5)
Eosinophils Relative: 2 %
HCT: 47.5 % (ref 39.0–52.0)
Hemoglobin: 16.2 g/dL (ref 13.0–17.0)
Immature Granulocytes: 1 %
Lymphocytes Relative: 46 %
Lymphs Abs: 4.2 10*3/uL — ABNORMAL HIGH (ref 0.7–4.0)
MCH: 32 pg (ref 26.0–34.0)
MCHC: 34.1 g/dL (ref 30.0–36.0)
MCV: 93.7 fL (ref 80.0–100.0)
Monocytes Absolute: 0.8 10*3/uL (ref 0.1–1.0)
Monocytes Relative: 8 %
Neutro Abs: 4 10*3/uL (ref 1.7–7.7)
Neutrophils Relative %: 43 %
Platelets: 178 10*3/uL (ref 150–400)
RBC: 5.07 MIL/uL (ref 4.22–5.81)
RDW: 14.3 % (ref 11.5–15.5)
WBC: 9.2 10*3/uL (ref 4.0–10.5)
nRBC: 0 % (ref 0.0–0.2)

## 2023-08-23 LAB — HIV ANTIBODY (ROUTINE TESTING W REFLEX): HIV Screen 4th Generation wRfx: NONREACTIVE

## 2023-08-23 LAB — BASIC METABOLIC PANEL WITH GFR
Anion gap: 7 (ref 5–15)
BUN: 18 mg/dL (ref 8–23)
CO2: 20 mmol/L — ABNORMAL LOW (ref 22–32)
Calcium: 9.1 mg/dL (ref 8.9–10.3)
Chloride: 109 mmol/L (ref 98–111)
Creatinine, Ser: 1.33 mg/dL — ABNORMAL HIGH (ref 0.61–1.24)
GFR, Estimated: 59 mL/min — ABNORMAL LOW (ref >60)
Glucose, Bld: 132 mg/dL — ABNORMAL HIGH (ref 70–99)
Potassium: 3.7 mmol/L (ref 3.5–5.1)
Sodium: 136 mmol/L (ref 135–145)

## 2023-08-23 LAB — MRSA NEXT GEN BY PCR, NASAL: MRSA by PCR Next Gen: NOT DETECTED

## 2023-08-23 MED ORDER — DIPHENHYDRAMINE HCL 50 MG/ML IJ SOLN
25.0000 mg | Freq: Once | INTRAMUSCULAR | Status: AC
  Administered 2023-08-23: 25 mg via INTRAVENOUS
  Filled 2023-08-23: qty 1

## 2023-08-23 MED ORDER — ALBUTEROL SULFATE (2.5 MG/3ML) 0.083% IN NEBU
2.5000 mg | INHALATION_SOLUTION | RESPIRATORY_TRACT | Status: DC | PRN
Start: 2023-08-23 — End: 2023-08-24

## 2023-08-23 MED ORDER — METHYLPREDNISOLONE SODIUM SUCC 125 MG IJ SOLR
125.0000 mg | Freq: Once | INTRAMUSCULAR | Status: AC
  Administered 2023-08-23: 125 mg via INTRAVENOUS
  Filled 2023-08-23: qty 2

## 2023-08-23 MED ORDER — ENOXAPARIN SODIUM 40 MG/0.4ML IJ SOSY
40.0000 mg | PREFILLED_SYRINGE | INTRAMUSCULAR | Status: DC
  Administered 2023-08-23: 40 mg via SUBCUTANEOUS
  Filled 2023-08-23: qty 0.4

## 2023-08-23 MED ORDER — FAMOTIDINE IN NACL 20-0.9 MG/50ML-% IV SOLN
20.0000 mg | Freq: Once | INTRAVENOUS | Status: AC
  Administered 2023-08-23: 20 mg via INTRAVENOUS
  Filled 2023-08-23: qty 50

## 2023-08-23 MED ORDER — ORAL CARE MOUTH RINSE
15.0000 mL | OROMUCOSAL | Status: DC | PRN
Start: 2023-08-23 — End: 2023-08-24

## 2023-08-23 MED ORDER — TRAZODONE HCL 50 MG PO TABS
25.0000 mg | ORAL_TABLET | Freq: Every evening | ORAL | Status: DC | PRN
  Administered 2023-08-23: 25 mg via ORAL
  Filled 2023-08-23: qty 1

## 2023-08-23 MED ORDER — CHLORHEXIDINE GLUCONATE CLOTH 2 % EX PADS
6.0000 | MEDICATED_PAD | Freq: Every day | CUTANEOUS | Status: DC
  Administered 2023-08-23: 6 via TOPICAL

## 2023-08-23 MED ORDER — ONDANSETRON HCL 4 MG PO TABS: 4.0000 mg | ORAL_TABLET | Freq: Four times a day (QID) | ORAL | Status: DC | PRN

## 2023-08-23 MED ORDER — ACETAMINOPHEN 325 MG PO TABS
650.0000 mg | ORAL_TABLET | Freq: Four times a day (QID) | ORAL | Status: DC | PRN
  Administered 2023-08-23: 650 mg via ORAL
  Filled 2023-08-23: qty 2

## 2023-08-23 MED ORDER — ONDANSETRON HCL 4 MG/2ML IJ SOLN: 4.0000 mg | Freq: Four times a day (QID) | INTRAMUSCULAR | Status: DC | PRN

## 2023-08-23 MED ORDER — SODIUM CHLORIDE 0.9 % IV BOLUS
1000.0000 mL | Freq: Once | INTRAVENOUS | Status: AC
  Administered 2023-08-23: 1000 mL via INTRAVENOUS

## 2023-08-23 MED ORDER — EPINEPHRINE 0.3 MG/0.3ML IJ SOAJ
0.3000 mg | Freq: Once | INTRAMUSCULAR | Status: AC
  Administered 2023-08-23: 0.3 mg via INTRAMUSCULAR
  Filled 2023-08-23: qty 0.3

## 2023-08-23 MED ORDER — ATORVASTATIN CALCIUM 40 MG PO TABS
80.0000 mg | ORAL_TABLET | Freq: Every day | ORAL | Status: DC
  Administered 2023-08-23: 80 mg via ORAL
  Filled 2023-08-23: qty 2

## 2023-08-23 MED ORDER — ACETAMINOPHEN 650 MG RE SUPP: 650.0000 mg | Freq: Four times a day (QID) | RECTAL | Status: DC | PRN

## 2023-08-23 NOTE — H&P (Signed)
 History and Physical  Robert Mckinney:[id number] DOB: [birth year] DOA: 08/23/2023  PCP: Mercy Stall, MD   Chief Complaint: Itching, tongue swelling  HPI: Robert Mckinney is a 68 y.o. male with medical history significant for hypertension, hyperlipidemia, OSA being admitted to the hospital for observation after anaphylaxis related to wasp sting.  History is provided by the patient and his wife who is at the bedside in the ER this morning, he was stung by a wasp on his left ear this morning.  About 20 minutes later, he noticed bilateral hand and arm itching, face itching, noticed that his tongue and lips were starting to swell.  He felt dizzy and lightheaded, wife states he got a little bit confused like he was going to pass out and was also having abdominal discomfort.  Brought to the ER by private vehicle was noticed to have hives, tongue and lip swelling.  He was given appropriate medication including epinephrine, Benadryl, famotidine, IV Solu-Medrol.  He has no significantly improved.  He was a bit hypotensive initially, never hypoxic.  He now feels that his tongue and mouth are almost completely back to normal.  Review of Systems: Please see HPI for pertinent positives and negatives. A complete 10 system review of systems are otherwise negative.  Past Medical History:  Diagnosis Date   Adjustment insomnia    Benign essential hypertension    COVID-19 11/02/2022   Mixed hyperlipidemia    Obstructive sleep apnea (adult) (pediatric)    Other testicular dysfunction    Past Surgical History:  Procedure Laterality Date   APPENDECTOMY     KNEE SURGERY Bilateral    LEFT HEART CATH AND CORONARY ANGIOGRAPHY N/A 03/20/2022   Procedure: LEFT HEART CATH AND CORONARY ANGIOGRAPHY;  Surgeon: Sammy Crisp, MD;  Location: [facility] INVASIVE [facility];  Service: Cardiovascular;  Laterality: N/A;   TONSILLECTOMY     Social History:  reports that he has never smoked. He has never used smokeless tobacco. He reports  current alcohol use of about 1.0 standard drink of alcohol per week. He reports that he does not use drugs.  Allergies  Allergen Reactions   Ace Inhibitors     cough   Rosuvastatin  Calcium  Cough    History reviewed. No pertinent family history.   Prior to Admission medications   Medication Sig Start Date End Date Taking? Authorizing Provider  aspirin  EC 81 MG tablet Take 1 tablet (81 mg total) by mouth daily. Swallow whole. 04/15/21   Eilleen Grates, MD  atorvastatin  (LIPITOR) 80 MG tablet Take 1 tablet (80 mg total) by mouth daily. 02/26/23   Eilleen Grates, MD  losartan  (COZAAR ) 100 MG tablet Take 1 tablet (100 mg total) by mouth daily. 08/08/22   Eilleen Grates, MD  metoprolol  succinate (TOPROL -XL) 50 MG 24 hr tablet Take 1 tablet (50 mg total) by mouth daily. Take with or immediately following a meal. 07/27/23   Eilleen Grates, MD  predniSONE  (STERAPRED UNI-PAK 21 TAB) 5 MG (21) TBPK tablet Take 1 tablet (5 mg total) by mouth as directed. 08/16/23   Hadassah Letters A, PA-C  traZODone  (DESYREL ) 150 MG tablet Take 1 tablet (150 mg total) by mouth at bedtime. 05/29/23   Mercy Stall, MD    Physical Exam: BP 106/60   Pulse (!) 54   Temp (!) 96 F (35.6 C) (Oral)   Resp 17   Ht 5' 9.5 (1.765 m)   Wt 108.6 kg   SpO2 100%   BMI 34.85 kg/m  General:  Alert, oriented, calm, in no acute distress  Eyes: EOMI, clear conjuctivae, white sclerea Neck: supple, no masses, trachea mildline  Cardiovascular: RRR, no murmurs or rubs, no peripheral edema  Respiratory: clear to auscultation bilaterally, no wheezes, no crackles  Abdomen: soft, nontender, nondistended, normal bowel tones heard  Skin: dry, no rashes  Musculoskeletal: no joint effusions, normal range of motion  Psychiatric: appropriate affect, normal speech  Neurologic: extraocular muscles intact, clear speech, moving all extremities with intact sensorium         Labs on Admission:  Basic Metabolic Panel: Recent Labs  Lab  08/23/23 0751  NA 136  K 3.7  CL 109  CO2 20*  GLUCOSE 132*  BUN 18  CREATININE 1.33*  CALCIUM  9.1   Liver Function Tests: No results for input(s): AST, ALT, ALKPHOS, BILITOT, PROT, ALBUMIN in the last 168 hours. No results for input(s): LIPASE, AMYLASE in the last 168 hours. No results for input(s): AMMONIA in the last 168 hours. CBC: Recent Labs  Lab 08/23/23 0751  WBC 9.2  NEUTROABS 4.0  HGB 16.2  HCT 47.5  MCV 93.7  PLT 178   Cardiac Enzymes: No results for input(s): CKTOTAL, CKMB, CKMBINDEX, TROPONINI in the last 168 hours. BNP (last 3 results) No results for input(s): BNP in the last 8760 hours.  ProBNP (last 3 results) No results for input(s): PROBNP in the last 8760 hours.  CBG: No results for input(s): GLUCAP in the last 168 hours.  Radiological Exams on Admission: No results found. Assessment/Plan [name] is a 68 y.o. male with medical history significant for hypertension, hyperlipidemia, OSA being admitted to the hospital for observation after anaphylaxis related to wasp sting.  Anaphylaxis related to wasp sting-responded well and symptoms are significantly improved.  Given severity of his reaction including hypotension, he will require observation for 12 to 24 hours. -Observation admission -Telemetry -Regular diet -IV Benadryl, IV famotidine, IV epinephrine in case of recurrent severe symptoms  Hyperlipidemia-Lipitor  Hypertension-hold home Toprol -XL and Cozaar  due to hypotension, he last took these medications last night  DVT prophylaxis: Lovenox     Code Status: Full Code  Consults called: None  Admission status: Observation  Time spent: 48 minutes  Sreya Froio Rickey Charm MD [facility] Pager [phone]  If 7PM-7AM, please contact night-coverage [email or web address] Password [facility]  08/23/2023, 11:33 AM

## 2023-08-23 NOTE — ED Triage Notes (Signed)
 Per wife, Pt was stung by something today. Pt has hives all over, his face and tongue is swollen, and wife reports that he has been losing consciousness.

## 2023-08-23 NOTE — ED Provider Notes (Addendum)
 [place] EMERGENCY DEPARTMENT AT [facility] Provider Note   CSN: [id number] Arrival date & time: 08/23/23  3086     Patient presents with: Allergic Reaction   Robert Mckinney is a 68 y.o. male.   Patient brought in by POV.  Patient is stung by 2 insects presumed to be some type of bee.  Shortly thereafter started getting swelling to his lips face tongue hives on his trunk.  And abdominal discomfort.  Thinks he got stung on the right earlobe and also on the back.  Patient is never had a reaction to bee stings before.  Past medical history significant for hypertension and for coronary artery disease.  Past medical history is also significant for mixed hyperlipidemia obstructive sleep apnea patient recently just finished a steroid course yesterday for swelling in the left knee.  Patient is not on blood thinners.  Patient able to talk.       Prior to Admission medications   Medication Sig Start Date End Date Taking? Authorizing Provider  aspirin  EC 81 MG tablet Take 1 tablet (81 mg total) by mouth daily. Swallow whole. 04/15/21   Eilleen Grates, MD  atorvastatin  (LIPITOR) 80 MG tablet Take 1 tablet (80 mg total) by mouth daily. 02/26/23   Eilleen Grates, MD  losartan  (COZAAR ) 100 MG tablet Take 1 tablet (100 mg total) by mouth daily. 08/08/22   Eilleen Grates, MD  metoprolol  succinate (TOPROL -XL) 50 MG 24 hr tablet Take 1 tablet (50 mg total) by mouth daily. Take with or immediately following a meal. 07/27/23   Eilleen Grates, MD  predniSONE  (STERAPRED UNI-PAK 21 TAB) 5 MG (21) TBPK tablet Take 1 tablet (5 mg total) by mouth as directed. 08/16/23   Hadassah Letters A, PA-C  traZODone  (DESYREL ) 150 MG tablet Take 1 tablet (150 mg total) by mouth at bedtime. 05/29/23   CoxBurleigh Carp, MD    Allergies: Ace inhibitors and Rosuvastatin  calcium     Review of Systems  Constitutional:  Negative for chills and fever.  HENT:  Positive for facial swelling. Negative for ear pain, sore throat,  trouble swallowing and voice change.   Eyes:  Negative for pain and visual disturbance.  Respiratory:  Negative for cough and shortness of breath.   Cardiovascular:  Negative for chest pain and palpitations.  Gastrointestinal:  Negative for abdominal pain and vomiting.  Genitourinary:  Negative for dysuria and hematuria.  Musculoskeletal:  Positive for joint swelling. Negative for arthralgias and back pain.  Skin:  Positive for rash. Negative for color change.  Neurological:  Negative for seizures and syncope.  All other systems reviewed and are negative.   Updated Vital Signs BP (!) 93/57 (BP Location: Right Arm)   Pulse (!) 41   Temp (!) 96 F (35.6 C) (Oral)   Resp 18   Ht 1.765 m (5' 9.5)   Wt 108.6 kg   SpO2 95%   BMI 34.85 kg/m   Physical Exam Vitals and nursing note reviewed.  Constitutional:      General: He is not in acute distress.    Appearance: Normal appearance. He is well-developed.  HENT:     Head: Normocephalic and atraumatic.     Mouth/Throat:     Comments: Tongue swollen but able to stick it out.  Talking fine.  Eyes:     Extraocular Movements: Extraocular movements intact.     Conjunctiva/sclera: Conjunctivae normal.     Pupils: Pupils are equal, round, and reactive to light.    Cardiovascular:  Rate and Rhythm: Normal rate and regular rhythm.     Heart sounds: No murmur heard. Pulmonary:     Effort: Pulmonary effort is normal. No respiratory distress.     Breath sounds: Normal breath sounds. No wheezing or rales.  Abdominal:     Palpations: Abdomen is soft.     Tenderness: There is no abdominal tenderness.   Musculoskeletal:        General: No swelling.     Cervical back: Normal range of motion and neck supple.     Right lower leg: No edema.     Left lower leg: No edema.   Skin:    General: Skin is warm and dry.     Capillary Refill: Capillary refill takes less than 2 seconds.     Findings: Erythema and rash present.     Comments:  Marked hives trunk face and upper arms.   Neurological:     General: No focal deficit present.     Mental Status: He is alert and oriented to person, place, and time.   Psychiatric:        Mood and Affect: Mood normal.     (all labs ordered are listed, but only abnormal results are displayed) Labs Reviewed  CBC WITH DIFFERENTIAL/PLATELET  BASIC METABOLIC PANEL WITH GFR    EKG: None  Radiology: No results found.   Procedures   Medications Ordered in the ED  famotidine (PEPCID) IVPB 20 mg premix (20 mg Intravenous New Bag/Given 08/23/23 0802)  methylPREDNISolone sodium succinate (SOLU-MEDROL) 125 mg/2 mL injection 125 mg (125 mg Intravenous Given 08/23/23 0801)  diphenhydrAMINE (BENADRYL) injection 25 mg (25 mg Intravenous Given 08/23/23 0759)                                    Medical Decision Making Amount and/or Complexity of Data Reviewed Labs: ordered.  Risk Prescription drug management. Decision regarding hospitalization.   Cardiac monitor showed sinus bradycardia with heart rate kind of around 38.  Occasionally P waves were difficult to see but the rate remained very regular.  Repeat EKG confirmed sinus bradycardia.  Initial blood pressure was 96.  The blood pressure went down to 83.  Patient given a liter of fluids 125 mg Solu-Medrol Pepcid and Benadryl.  Patient's wife states that she gave him Benadryl and is in a Zyrtec at home.  Patient is never had a reaction like this before.  Patient's blood pressure now up to 93 tongue swelling lip swelling hives erythema resolving significantly after the Solu-Medrol Benadryl and Pepcid.  Had contemplated epinephrine.  But due to his heart situation decided to hold off on that.  Unless he did not respond to the initial therapies.  CRITICAL CARE Performed by: Oronde Hallenbeck Total critical care time: 45 minutes Critical care time was exclusive of separately billable procedures and treating other patients. Critical care was  necessary to treat or prevent imminent or life-threatening deterioration. Critical care was time spent personally by me on the following activities: development of treatment plan with patient and/or surrogate as well as nursing, discussions with consultants, evaluation of patient's response to treatment, examination of patient, obtaining history from patient or surrogate, ordering and performing treatments and interventions, ordering and review of laboratory studies, ordering and review of radiographic studies, pulse oximetry and re-evaluation of patient's condition.  Patient doing better after the EpiPen.  Blood pressure is up to 111.  Heart rate  up into the 50s.  But feel the patient needs to be admitted and observed.  Most like him be admitted by hospitalist service.  But will discuss with critical care.  Tongue swelling still present but markedly improved.  Patient did require 2 L of fluid as well.  Discussed with critical care.  They recommend hospitalist admission to stepdown.  Currently patient's blood pressure is slightly over 100.  He is remaining stable heart rate is up into the 50s currently.   Final diagnoses:  Anaphylaxis, initial encounter  Allergic reaction, initial encounter    ED Discharge Orders     None          Nicklas Barns, MD 08/23/23 1610    Jaxie Racanelli, MD 08/23/23 1025    Nicklas Barns, MD 08/23/23 1038

## 2023-08-23 NOTE — Plan of Care (Signed)

## 2023-08-24 ENCOUNTER — Other Ambulatory Visit (HOSPITAL_COMMUNITY): Payer: Self-pay

## 2023-08-24 DIAGNOSIS — E66812 Obesity, class 2: Secondary | ICD-10-CM

## 2023-08-24 DIAGNOSIS — T63461A Toxic effect of venom of wasps, accidental (unintentional), initial encounter: Secondary | ICD-10-CM | POA: Diagnosis not present

## 2023-08-24 DIAGNOSIS — I10 Essential (primary) hypertension: Secondary | ICD-10-CM

## 2023-08-24 DIAGNOSIS — Z6835 Body mass index (BMI) 35.0-35.9, adult: Secondary | ICD-10-CM

## 2023-08-24 DIAGNOSIS — E785 Hyperlipidemia, unspecified: Secondary | ICD-10-CM | POA: Diagnosis not present

## 2023-08-24 DIAGNOSIS — G4733 Obstructive sleep apnea (adult) (pediatric): Secondary | ICD-10-CM | POA: Diagnosis not present

## 2023-08-24 DIAGNOSIS — T782XXA Anaphylactic shock, unspecified, initial encounter: Secondary | ICD-10-CM | POA: Diagnosis not present

## 2023-08-24 MED ORDER — EPINEPHRINE 0.3 MG/0.3ML IJ SOAJ
0.3000 mg | INTRAMUSCULAR | 2 refills | Status: DC | PRN
  Filled 2023-08-24: qty 2, 7d supply, fill #0
  Filled 2023-09-24: qty 0.3, 30d supply, fill #1

## 2023-08-24 MED ORDER — DIPHENHYDRAMINE HCL 25 MG PO TABS
25.0000 mg | ORAL_TABLET | Freq: Four times a day (QID) | ORAL | 0 refills | Status: AC
  Filled 2023-08-24: qty 8, 2d supply, fill #0

## 2023-08-24 MED ORDER — PREDNISONE 20 MG PO TABS
40.0000 mg | ORAL_TABLET | Freq: Every day | ORAL | 0 refills | Status: AC
  Filled 2023-08-24: qty 4, 2d supply, fill #0

## 2023-08-24 MED ORDER — FAMOTIDINE 20 MG PO TABS
20.0000 mg | ORAL_TABLET | Freq: Two times a day (BID) | ORAL | 0 refills | Status: AC
  Filled 2023-08-24: qty 4, 2d supply, fill #0

## 2023-08-24 NOTE — Discharge Summary (Signed)
 Physician Discharge Summary   Patient: Robert Mckinney MRN: [id number] DOB: [birth year]  Admit date:     08/23/2023  Discharge date: 08/24/23  Discharge Physician: Aneita Keens, MD   PCP: Mercy Stall, MD   Recommendations at discharge:  PCP visit for hospital follow-up Asthma and allergy medicine referral  Discharge Diagnoses: Principal Problem:   Wasp sting-induced anaphylaxis  Resolved Problems:   * No resolved hospital problems. [place] Hospital Course: No notes on file  Assessment and Plan:  Anaphylaxis Present on admission presumed secondary to a wasp sting.  Patient with symptoms of face, tongue, lips swelling in addition to hives on his trunk.  Patient noted to have associated hypotension.  Patient was initially treated in the Emergency Department with Solu-Medrol, Pepcid, Benadryl and was also given a dose of epinephrine IM.  Patient was admitted for observation secondary to associated hypotension.  Overnight, patient was stable.  Will discharge home with EpiPen, prednisone , Pepcid, Benadryl.  Recommended that patient follow-up with an asthma and allergy specialist for formal allergy testing.  Sinus bradycardia Noted during hospitalization and overnight.  Likely related to sleep apnea history.  Hyperlipidemia Continue Lipitor  Primary hypertension Toprol -XL and Cozaar  held secondary to hypotension.  Blood pressure now back to baseline.  Resume home regimen on discharge.  Insomnia Continue trazodone .   Consultants: None Procedures performed: None Disposition: Home Diet recommendation: Regular diet   DISCHARGE MEDICATION: Allergies as of 08/24/2023       Reactions   Ace Inhibitors    cough   Rosuvastatin  Calcium  Cough        Medication List     STOP taking these medications    predniSONE  5 MG (21) Tbpk tablet Commonly known as: STERAPRED UNI-PAK 21 TAB Replaced by: predniSONE  20 MG tablet       TAKE these medications    aspirin  EC 81 MG tablet Take  1 tablet (81 mg total) by mouth daily. Swallow whole.   atorvastatin  80 MG tablet Commonly known as: LIPITOR Take 1 tablet (80 mg total) by mouth daily.   diphenhydrAMINE 25 MG tablet Commonly known as: Benadryl Allergy Take 1 tablet (25 mg total) by mouth every 6 (six) hours for 2 days.   EPINEPHrine 0.3 mg/0.3 mL Soaj injection Commonly known as: EPI-PEN Inject 0.3 mg into the muscle as needed for anaphylaxis.   famotidine 20 MG tablet Commonly known as: PEPCID Take 1 tablet (20 mg total) by mouth 2 (two) times daily for 2 days.   losartan  100 MG tablet Commonly known as: COZAAR  Take 1 tablet (100 mg total) by mouth daily.   metoprolol  succinate 50 MG 24 hr tablet Commonly known as: TOPROL -XL Take 1 tablet (50 mg total) by mouth daily. Take with or immediately following a meal.   predniSONE  20 MG tablet Commonly known as: DELTASONE  Take 2 tablets (40 mg total) by mouth daily with breakfast for 2 days. Replaces: predniSONE  5 MG (21) Tbpk tablet   traZODone  150 MG tablet Commonly known as: DESYREL  Take 1 tablet (150 mg total) by mouth at bedtime.        Follow-up Information     Wierenga, Kirsten, MD. Schedule an appointment as soon as possible for a visit in 1 week(s).   Specialty: Family Medicine Why: For hospital follow-up Contact information: [address] [place] [address] [phone]         [facility] [place] [facility] [place]. Schedule an appointment as soon as  possible for a visit.   Specialty: Allergy and Asthma  Contact information: [address] [place]   [address] [phone]               Discharge Exam: BP (!) 158/69 (BP Location: Right Arm)   Pulse 69   Temp 98 F (36.7 C) (Oral)   Resp 16   Ht 5' 9 (1.753 m)   Wt 108.6 kg   SpO2 (!) 83%   BMI 35.36 kg/m   General exam: Appears calm and comfortable HEENT: Moist mucous membranes.  Tongue appears normal with no swelling noted.   Neck is nontender with no swelling noted.   Respiratory system: Clear to auscultation. Respiratory effort normal. Cardiovascular system: S1 & S2 heard, RRR. No murmurs, rubs, gallops or clicks. Gastrointestinal system: Abdomen is soft and nontender. Normal bowel sounds heard. Central nervous system: Alert and oriented. No focal neurological deficits. Psychiatry: Judgement and insight appear normal. Mood & affect appropriate.   Condition at discharge: stable  The results of significant diagnostics from this hospitalization (including imaging, microbiology, ancillary and laboratory) are listed below for reference.   Imaging Studies: No results found.  Microbiology: Results for orders placed or performed during the hospital encounter of 08/23/23  MRSA Next Gen by PCR, Nasal     Status: None   Collection Time: 08/23/23 12:19 PM   Specimen: Nasal Mucosa; Nasal Swab  Result Value Ref Range Status   MRSA by PCR Next Gen NOT DETECTED NOT DETECTED Final    Comment: (NOTE) The GeneXpert MRSA Assay (FDA approved for NASAL specimens only), is one component of a comprehensive MRSA colonization surveillance program. It is not intended to diagnose MRSA infection nor to guide or monitor treatment for MRSA infections. Test performance is not FDA approved in patients less than 77 years old. Performed at [facility], [address], [place], [place] [address]     Labs: CBC: Recent Labs  Lab 08/23/23 0751  WBC 9.2  NEUTROABS 4.0  HGB 16.2  HCT 47.5  MCV 93.7  PLT 178   Basic Metabolic Panel: Recent Labs  Lab 08/23/23 0751  NA 136  K 3.7  CL 109  CO2 20*  GLUCOSE 132*  BUN 18  CREATININE 1.33*  CALCIUM  9.1    Discharge time spent: 35 minutes.  Signed: Aneita Keens, MD [facility] 08/24/2023

## 2023-08-24 NOTE — Progress Notes (Signed)
 Discharge medications delivered to patients nurse Claudene Crystal RN    DFranklin RN

## 2023-08-24 NOTE — Discharge Instructions (Addendum)
 Robert Mckinney,  You were in the hospital because of a severe allergic reaction. This has stabilized with medication treatment. Please follow-up with your PCP. I am also referring you to an asthma & allergy clinic for further testing. Please, do not drive while taking Benadryl. You can decrease the Benadryl to twice per day if it is making you too drowsy.

## 2023-08-24 NOTE — Progress Notes (Signed)
   08/24/23 1000  TOC Brief Assessment  Insurance and Status Reviewed ([employer] / [employer])  Patient has primary care physician Yes (Jenkinson, Kirsten, MD)  Home environment has been reviewed Home with spouse  Prior level of function: Independent  Prior/Current Home Services No current home services  Social Drivers of Health Review SDOH reviewed no interventions necessary  Readmission risk has been reviewed Yes  Transition of care needs no transition of care needs at this time

## 2023-08-29 DIAGNOSIS — M25562 Pain in left knee: Secondary | ICD-10-CM | POA: Diagnosis not present

## 2023-09-10 ENCOUNTER — Other Ambulatory Visit (HOSPITAL_COMMUNITY): Payer: Self-pay

## 2023-09-10 ENCOUNTER — Other Ambulatory Visit: Payer: Self-pay | Admitting: Cardiology

## 2023-09-10 ENCOUNTER — Other Ambulatory Visit: Payer: Self-pay

## 2023-09-10 DIAGNOSIS — I10 Essential (primary) hypertension: Secondary | ICD-10-CM

## 2023-09-12 ENCOUNTER — Other Ambulatory Visit (HOSPITAL_COMMUNITY): Payer: Self-pay

## 2023-09-12 ENCOUNTER — Other Ambulatory Visit: Payer: Self-pay

## 2023-09-12 MED ORDER — LOSARTAN POTASSIUM 100 MG PO TABS
100.0000 mg | ORAL_TABLET | Freq: Every day | ORAL | 3 refills | Status: AC
  Filled 2023-09-12: qty 90, 90d supply, fill #0
  Filled 2023-12-10: qty 90, 90d supply, fill #1
  Filled 2024-03-11: qty 90, 90d supply, fill #2

## 2023-09-14 DIAGNOSIS — D485 Neoplasm of uncertain behavior of skin: Secondary | ICD-10-CM | POA: Diagnosis not present

## 2023-09-14 DIAGNOSIS — D2239 Melanocytic nevi of other parts of face: Secondary | ICD-10-CM | POA: Diagnosis not present

## 2023-09-14 DIAGNOSIS — L820 Inflamed seborrheic keratosis: Secondary | ICD-10-CM | POA: Diagnosis not present

## 2023-09-14 DIAGNOSIS — R233 Spontaneous ecchymoses: Secondary | ICD-10-CM | POA: Diagnosis not present

## 2023-09-14 DIAGNOSIS — L300 Nummular dermatitis: Secondary | ICD-10-CM | POA: Diagnosis not present

## 2023-09-14 DIAGNOSIS — L570 Actinic keratosis: Secondary | ICD-10-CM | POA: Diagnosis not present

## 2023-09-14 DIAGNOSIS — D225 Melanocytic nevi of trunk: Secondary | ICD-10-CM | POA: Diagnosis not present

## 2023-09-24 ENCOUNTER — Other Ambulatory Visit (HOSPITAL_COMMUNITY): Payer: Self-pay

## 2023-09-24 ENCOUNTER — Other Ambulatory Visit (HOSPITAL_COMMUNITY): Payer: Self-pay | Admitting: Family Medicine

## 2023-09-24 MED ORDER — EPINEPHRINE 0.3 MG/0.3ML IJ SOAJ
0.3000 mg | INTRAMUSCULAR | 0 refills | Status: AC | PRN
  Filled 2023-09-24: qty 2, 7d supply, fill #0

## 2023-10-01 ENCOUNTER — Other Ambulatory Visit: Payer: Self-pay | Admitting: Cardiology

## 2023-10-01 ENCOUNTER — Other Ambulatory Visit (HOSPITAL_COMMUNITY): Payer: Self-pay

## 2023-10-02 ENCOUNTER — Other Ambulatory Visit: Payer: Self-pay

## 2023-10-02 ENCOUNTER — Other Ambulatory Visit (HOSPITAL_COMMUNITY): Payer: Self-pay

## 2023-10-02 MED FILL — Atorvastatin Calcium Tab 80 MG (Base Equivalent): ORAL | 90 days supply | Qty: 90 | Fill #0 | Status: AC

## 2023-10-18 DIAGNOSIS — M25562 Pain in left knee: Secondary | ICD-10-CM | POA: Diagnosis not present

## 2023-10-18 DIAGNOSIS — M25462 Effusion, left knee: Secondary | ICD-10-CM | POA: Diagnosis not present

## 2023-10-24 ENCOUNTER — Other Ambulatory Visit: Payer: Self-pay

## 2023-10-30 ENCOUNTER — Other Ambulatory Visit (HOSPITAL_COMMUNITY): Payer: Self-pay

## 2023-11-07 ENCOUNTER — Other Ambulatory Visit (HOSPITAL_COMMUNITY): Payer: Self-pay

## 2023-11-11 ENCOUNTER — Ambulatory Visit: Payer: Self-pay | Admitting: Internal Medicine

## 2023-11-11 NOTE — Progress Notes (Signed)
 The CT scan results from sumemr 2024 (last year) are very reassuring . If you are feeling well no followup needed. My apologies for delay in releasing results Xxxxxx MPRESSION: 1. Air trapping, suggesting small airways disease. 2. No definitive imaging findings to suggest interstitial lung disease on today's examination. 3. Small pulmonary nodules measuring 4 mm or less in size, stable compared to prior studies dating back to 2022, considered definitively benign (no imaging follow-up recommended). 4. Aortic atherosclerosis, in addition to left main and three-vessel coronary artery disease. Please note that although the presence of coronary artery calcium  documents the presence of coronary artery disease, the severity of this disease and any potential stenosis cannot be assessed on this non-gated CT examination. Assessment for potential risk factor modification, dietary therapy or pharmacologic therapy may be warranted, if clinically indicated.   Aortic Atherosclerosis (ICD10-I70.0).     Electronically Signed   By: Toribio Aye M.D.   On: 07/23/2022 13:00

## 2023-11-14 DIAGNOSIS — T63461A Toxic effect of venom of wasps, accidental (unintentional), initial encounter: Secondary | ICD-10-CM | POA: Diagnosis not present

## 2023-11-14 DIAGNOSIS — T63451D Toxic effect of venom of hornets, accidental (unintentional), subsequent encounter: Secondary | ICD-10-CM | POA: Diagnosis not present

## 2023-11-14 DIAGNOSIS — J309 Allergic rhinitis, unspecified: Secondary | ICD-10-CM | POA: Diagnosis not present

## 2023-11-20 NOTE — Progress Notes (Signed)
   11/20/2023  Patient ID: Robert Mckinney, male   DOB: 1956/02/19, 68 y.o.   MRN: [id number]  Pharmacy Quality Measure Review  This patient is appearing on a report for being at risk of failing the adherence measure for cholesterol (statin) medications this calendar year.   Medication: atorvastatin  80mg . Last disp 10/03/2023 90 DS   Insurance report was not up to date. No action needed at this time.   Lang Sieve, PharmD, BCGP Clinical Pharmacist  [phone]

## 2023-12-06 DIAGNOSIS — T63441D Toxic effect of venom of bees, accidental (unintentional), subsequent encounter: Secondary | ICD-10-CM | POA: Diagnosis not present

## 2023-12-06 DIAGNOSIS — T63461D Toxic effect of venom of wasps, accidental (unintentional), subsequent encounter: Secondary | ICD-10-CM | POA: Diagnosis not present

## 2023-12-06 DIAGNOSIS — T63451D Toxic effect of venom of hornets, accidental (unintentional), subsequent encounter: Secondary | ICD-10-CM | POA: Diagnosis not present

## 2023-12-10 ENCOUNTER — Other Ambulatory Visit: Payer: Self-pay | Admitting: Family Medicine

## 2023-12-10 ENCOUNTER — Other Ambulatory Visit: Payer: Self-pay

## 2023-12-10 DIAGNOSIS — F5102 Adjustment insomnia: Secondary | ICD-10-CM

## 2023-12-10 MED ORDER — TRAZODONE HCL 150 MG PO TABS
150.0000 mg | ORAL_TABLET | Freq: Every day | ORAL | 1 refills | Status: DC
  Filled 2023-12-10: qty 90, 90d supply, fill #0
  Filled 2024-03-05: qty 90, 90d supply, fill #1

## 2023-12-11 ENCOUNTER — Other Ambulatory Visit (HOSPITAL_COMMUNITY): Payer: Self-pay

## 2023-12-13 DIAGNOSIS — T63441D Toxic effect of venom of bees, accidental (unintentional), subsequent encounter: Secondary | ICD-10-CM | POA: Diagnosis not present

## 2023-12-13 DIAGNOSIS — T63451D Toxic effect of venom of hornets, accidental (unintentional), subsequent encounter: Secondary | ICD-10-CM | POA: Diagnosis not present

## 2023-12-13 DIAGNOSIS — T63461D Toxic effect of venom of wasps, accidental (unintentional), subsequent encounter: Secondary | ICD-10-CM | POA: Diagnosis not present

## 2023-12-19 DIAGNOSIS — L719 Rosacea, unspecified: Secondary | ICD-10-CM | POA: Diagnosis not present

## 2023-12-19 DIAGNOSIS — L703 Acne tropica: Secondary | ICD-10-CM | POA: Diagnosis not present

## 2023-12-20 ENCOUNTER — Other Ambulatory Visit (HOSPITAL_COMMUNITY): Payer: Self-pay

## 2023-12-20 MED ORDER — CLOBETASOL PROPIONATE 0.05 % EX SOLN
CUTANEOUS | 3 refills | Status: AC
  Filled 2023-12-20: qty 50, 30d supply, fill #0
  Filled 2024-01-16: qty 50, 30d supply, fill #1

## 2023-12-20 MED ORDER — METRONIDAZOLE 0.75 % EX CREA
TOPICAL_CREAM | CUTANEOUS | 3 refills | Status: AC
  Filled 2023-12-20: qty 45, 30d supply, fill #0
  Filled 2024-01-16: qty 45, 30d supply, fill #1

## 2023-12-25 NOTE — Progress Notes (Signed)
   12/25/2023  Patient ID: Robert Mckinney Free, male   DOB: 1955/08/30, 68 y.o.   MRN: [id number]  Pharmacy Quality Measure Review  This patient is appearing on a report for being at risk of failing the adherence measure for cholesterol (statin) medications this calendar year.   Medication: atorvastatin  80mg   Last fill date: shipped 10/03/23 90 ds though [facility]  Insurance report was not up to date. No action needed at this time.   Lang Sieve, PharmD, BCGP Clinical Pharmacist  [phone]

## 2023-12-26 ENCOUNTER — Other Ambulatory Visit (HOSPITAL_COMMUNITY): Payer: Self-pay

## 2023-12-26 ENCOUNTER — Other Ambulatory Visit: Payer: Self-pay

## 2023-12-26 MED FILL — Atorvastatin Calcium Tab 80 MG (Base Equivalent): ORAL | 90 days supply | Qty: 90 | Fill #1 | Status: AC

## 2023-12-27 DIAGNOSIS — T63441D Toxic effect of venom of bees, accidental (unintentional), subsequent encounter: Secondary | ICD-10-CM | POA: Diagnosis not present

## 2023-12-27 DIAGNOSIS — T63461D Toxic effect of venom of wasps, accidental (unintentional), subsequent encounter: Secondary | ICD-10-CM | POA: Diagnosis not present

## 2023-12-27 DIAGNOSIS — M25511 Pain in right shoulder: Secondary | ICD-10-CM | POA: Diagnosis not present

## 2023-12-27 DIAGNOSIS — T63451D Toxic effect of venom of hornets, accidental (unintentional), subsequent encounter: Secondary | ICD-10-CM | POA: Diagnosis not present

## 2023-12-28 ENCOUNTER — Other Ambulatory Visit (HOSPITAL_COMMUNITY): Payer: Self-pay

## 2023-12-31 ENCOUNTER — Other Ambulatory Visit (HOSPITAL_BASED_OUTPATIENT_CLINIC_OR_DEPARTMENT_OTHER): Payer: Self-pay

## 2024-01-03 ENCOUNTER — Other Ambulatory Visit: Payer: Self-pay

## 2024-01-03 DIAGNOSIS — T63441D Toxic effect of venom of bees, accidental (unintentional), subsequent encounter: Secondary | ICD-10-CM | POA: Diagnosis not present

## 2024-01-03 DIAGNOSIS — T63451D Toxic effect of venom of hornets, accidental (unintentional), subsequent encounter: Secondary | ICD-10-CM | POA: Diagnosis not present

## 2024-01-03 DIAGNOSIS — T63461D Toxic effect of venom of wasps, accidental (unintentional), subsequent encounter: Secondary | ICD-10-CM | POA: Diagnosis not present

## 2024-01-08 ENCOUNTER — Other Ambulatory Visit (HOSPITAL_COMMUNITY): Payer: Self-pay

## 2024-01-09 ENCOUNTER — Other Ambulatory Visit (HOSPITAL_COMMUNITY): Payer: Self-pay

## 2024-01-10 DIAGNOSIS — T63461D Toxic effect of venom of wasps, accidental (unintentional), subsequent encounter: Secondary | ICD-10-CM | POA: Diagnosis not present

## 2024-01-10 DIAGNOSIS — T63441D Toxic effect of venom of bees, accidental (unintentional), subsequent encounter: Secondary | ICD-10-CM | POA: Diagnosis not present

## 2024-01-10 DIAGNOSIS — T63451D Toxic effect of venom of hornets, accidental (unintentional), subsequent encounter: Secondary | ICD-10-CM | POA: Diagnosis not present

## 2024-01-14 ENCOUNTER — Other Ambulatory Visit (HOSPITAL_BASED_OUTPATIENT_CLINIC_OR_DEPARTMENT_OTHER): Payer: Self-pay

## 2024-01-16 ENCOUNTER — Other Ambulatory Visit (HOSPITAL_COMMUNITY): Payer: Self-pay

## 2024-01-16 ENCOUNTER — Other Ambulatory Visit (HOSPITAL_BASED_OUTPATIENT_CLINIC_OR_DEPARTMENT_OTHER): Payer: Self-pay

## 2024-01-17 DIAGNOSIS — T63461D Toxic effect of venom of wasps, accidental (unintentional), subsequent encounter: Secondary | ICD-10-CM | POA: Diagnosis not present

## 2024-01-17 DIAGNOSIS — T63451D Toxic effect of venom of hornets, accidental (unintentional), subsequent encounter: Secondary | ICD-10-CM | POA: Diagnosis not present

## 2024-01-17 DIAGNOSIS — T63441D Toxic effect of venom of bees, accidental (unintentional), subsequent encounter: Secondary | ICD-10-CM | POA: Diagnosis not present

## 2024-01-24 ENCOUNTER — Other Ambulatory Visit (HOSPITAL_COMMUNITY): Payer: Self-pay

## 2024-01-24 DIAGNOSIS — T63441D Toxic effect of venom of bees, accidental (unintentional), subsequent encounter: Secondary | ICD-10-CM | POA: Diagnosis not present

## 2024-01-24 DIAGNOSIS — T63461D Toxic effect of venom of wasps, accidental (unintentional), subsequent encounter: Secondary | ICD-10-CM | POA: Diagnosis not present

## 2024-01-24 DIAGNOSIS — T63451D Toxic effect of venom of hornets, accidental (unintentional), subsequent encounter: Secondary | ICD-10-CM | POA: Diagnosis not present

## 2024-01-30 DIAGNOSIS — T63421D Toxic effect of venom of ants, accidental (unintentional), subsequent encounter: Secondary | ICD-10-CM | POA: Diagnosis not present

## 2024-01-30 DIAGNOSIS — T63451D Toxic effect of venom of hornets, accidental (unintentional), subsequent encounter: Secondary | ICD-10-CM | POA: Diagnosis not present

## 2024-01-30 DIAGNOSIS — T63441D Toxic effect of venom of bees, accidental (unintentional), subsequent encounter: Secondary | ICD-10-CM | POA: Diagnosis not present

## 2024-01-30 DIAGNOSIS — T63461D Toxic effect of venom of wasps, accidental (unintentional), subsequent encounter: Secondary | ICD-10-CM | POA: Diagnosis not present

## 2024-02-07 DIAGNOSIS — T63441D Toxic effect of venom of bees, accidental (unintentional), subsequent encounter: Secondary | ICD-10-CM | POA: Diagnosis not present

## 2024-02-07 DIAGNOSIS — T63461D Toxic effect of venom of wasps, accidental (unintentional), subsequent encounter: Secondary | ICD-10-CM | POA: Diagnosis not present

## 2024-02-07 DIAGNOSIS — T63451D Toxic effect of venom of hornets, accidental (unintentional), subsequent encounter: Secondary | ICD-10-CM | POA: Diagnosis not present

## 2024-02-22 ENCOUNTER — Other Ambulatory Visit (HOSPITAL_COMMUNITY): Payer: Self-pay

## 2024-02-22 MED ORDER — MELOXICAM 15 MG PO TABS
15.0000 mg | ORAL_TABLET | Freq: Every day | ORAL | 2 refills | Status: AC
  Filled 2024-02-22: qty 30, 30d supply, fill #0
  Filled 2024-04-02: qty 30, 30d supply, fill #1

## 2024-02-22 MED ORDER — METHYLPREDNISOLONE 4 MG PO TABS
ORAL_TABLET | ORAL | 0 refills | Status: AC
  Filled 2024-02-22: qty 21, 6d supply, fill #0

## 2024-02-24 ENCOUNTER — Other Ambulatory Visit (HOSPITAL_COMMUNITY): Payer: Self-pay

## 2024-03-05 ENCOUNTER — Other Ambulatory Visit (HOSPITAL_COMMUNITY): Payer: Self-pay

## 2024-03-11 ENCOUNTER — Other Ambulatory Visit (HOSPITAL_COMMUNITY): Payer: Self-pay

## 2024-04-02 ENCOUNTER — Telehealth: Payer: Self-pay | Admitting: Family Medicine

## 2024-04-02 ENCOUNTER — Other Ambulatory Visit (HOSPITAL_COMMUNITY): Payer: Self-pay

## 2024-04-02 ENCOUNTER — Other Ambulatory Visit: Payer: Self-pay

## 2024-04-02 DIAGNOSIS — F5102 Adjustment insomnia: Secondary | ICD-10-CM

## 2024-04-02 MED ORDER — TRAZODONE HCL 150 MG PO TABS
150.0000 mg | ORAL_TABLET | Freq: Every day | ORAL | 1 refills | Status: AC
  Filled 2024-04-02: qty 90, 90d supply, fill #0

## 2024-04-02 MED FILL — Atorvastatin Calcium Tab 80 MG (Base Equivalent): ORAL | 90 days supply | Qty: 90 | Fill #2 | Status: AC

## 2024-04-02 NOTE — Telephone Encounter (Unsigned)
 Copied from CRM [id number]. Topic: Clinical - Medication Refill >> Apr 02, 2024  3:11 PM Tiffini S wrote: Medication: traZODone  (DESYREL ) 150 MG tablet  Has the patient contacted their pharmacy? No (Agent: If no, request that the patient contact the pharmacy for the refill. If patient does not wish to contact the pharmacy document the reason why and proceed with request.) (Agent: If yes, when and what did the pharmacy advise?)  This is the patient's preferred pharmacy:  [facility] [address] [place] [address] Phone: [phone] Fax: [phone]    Is this the correct pharmacy for this prescription? Yes If no, delete pharmacy and type the correct one.   Has the prescription been filled recently? Yes  Is the patient out of the medication? No  Has the patient been seen for an appointment in the last year OR does the patient have an upcoming appointment? Yes  Can we respond through MyChart? Yes  Agent: Please be advised that Rx refills may take up to 3 business days. We ask that you follow-up with your pharmacy.

## 2024-04-03 ENCOUNTER — Other Ambulatory Visit (HOSPITAL_COMMUNITY): Payer: Self-pay

## 2024-07-10 ENCOUNTER — Ambulatory Visit

## 2024-07-30 ENCOUNTER — Ambulatory Visit: Admitting: Cardiology
